# Patient Record
Sex: Female | Born: 1995 | Race: White | Hispanic: No | Marital: Single | Smoking: Never smoker
Health system: Southern US, Community
[De-identification: ages and names within clinical notes are randomized; demographics above are authoritative.]

## PROBLEM LIST (undated history)

## (undated) DIAGNOSIS — R42 Dizziness and giddiness: Secondary | ICD-10-CM

## (undated) DIAGNOSIS — K589 Irritable bowel syndrome without diarrhea: Secondary | ICD-10-CM

## (undated) DIAGNOSIS — T7840XA Allergy, unspecified, initial encounter: Secondary | ICD-10-CM

## (undated) DIAGNOSIS — S0291XA Unspecified fracture of skull, initial encounter for closed fracture: Secondary | ICD-10-CM

## (undated) DIAGNOSIS — Z9289 Personal history of other medical treatment: Secondary | ICD-10-CM

## (undated) DIAGNOSIS — O139 Gestational [pregnancy-induced] hypertension without significant proteinuria, unspecified trimester: Secondary | ICD-10-CM

## (undated) HISTORY — PX: WISDOM TOOTH EXTRACTION: SHX21

## (undated) HISTORY — DX: Gestational (pregnancy-induced) hypertension without significant proteinuria, unspecified trimester: O13.9

## (undated) HISTORY — DX: Irritable bowel syndrome, unspecified: K58.9

## (undated) HISTORY — PX: TONSILLECTOMY: SUR1361

## (undated) HISTORY — DX: Allergy, unspecified, initial encounter: T78.40XA

## (undated) HISTORY — PX: OTHER SURGICAL HISTORY: SHX169

## (undated) HISTORY — PX: ADENOIDECTOMY: SUR15

---
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
## 2016-06-17 ENCOUNTER — Emergency Department (HOSPITAL_COMMUNITY)
Admission: EM | Admit: 2016-06-17 | Discharge: 2016-06-17 | Disposition: A | Discharge status: Home / Self Care | Payer: BC Managed Care – PPO | Attending: Emergency Medicine | Admitting: Emergency Medicine

## 2016-06-17 ENCOUNTER — Emergency Department (HOSPITAL_COMMUNITY): Payer: BC Managed Care – PPO

## 2016-06-17 ENCOUNTER — Encounter (HOSPITAL_COMMUNITY): Payer: Self-pay | Admitting: *Deleted

## 2016-06-17 DIAGNOSIS — Y9389 Activity, other specified: Secondary | ICD-10-CM | POA: Insufficient documentation

## 2016-06-17 DIAGNOSIS — Y92410 Unspecified street and highway as the place of occurrence of the external cause: Secondary | ICD-10-CM | POA: Diagnosis not present

## 2016-06-17 DIAGNOSIS — Z7722 Contact with and (suspected) exposure to environmental tobacco smoke (acute) (chronic): Secondary | ICD-10-CM | POA: Diagnosis not present

## 2016-06-17 DIAGNOSIS — Y999 Unspecified external cause status: Secondary | ICD-10-CM | POA: Diagnosis not present

## 2016-06-17 DIAGNOSIS — S6991XA Unspecified injury of right wrist, hand and finger(s), initial encounter: Secondary | ICD-10-CM | POA: Diagnosis present

## 2016-06-17 DIAGNOSIS — S63501A Unspecified sprain of right wrist, initial encounter: Secondary | ICD-10-CM | POA: Diagnosis not present

## 2016-06-17 MED ORDER — CYCLOBENZAPRINE HCL 10 MG PO TABS: 10.0000 mg | ORAL_TABLET | Freq: Every evening | ORAL | 0 refills | Status: DC | PRN

## 2016-06-17 MED ORDER — MELOXICAM 15 MG PO TABS: 15.0000 mg | ORAL_TABLET | Freq: Every day | ORAL | 0 refills | Status: DC

## 2016-06-17 MED ORDER — HYDROCODONE-ACETAMINOPHEN 5-325 MG PO TABS
1.0000 | ORAL_TABLET | Freq: Once | ORAL | Status: AC
  Administered 2016-06-17: 1 via ORAL
  Filled 2016-06-17: qty 1

## 2016-06-17 NOTE — Discharge Instructions (Signed)
Ice - ice for 20 minutes at a time, several times a day Compression - wear brace to provide support Take Meloxicam once daily. You can also take Tylenol with this medicine but avoid Ibuprofen or Naproxen while taking Meloxicam. Take muscle relaxer as needed for sleep
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
## 2016-10-26 ENCOUNTER — Encounter: Payer: Self-pay | Admitting: Pediatrics

## 2016-10-26 ENCOUNTER — Ambulatory Visit (INDEPENDENT_AMBULATORY_CARE_PROVIDER_SITE_OTHER): Payer: BC Managed Care – PPO | Admitting: Pediatrics

## 2016-10-26 VITALS — BP 118/75 | Temp 97.4°F | Wt 113.6 lb

## 2016-10-26 DIAGNOSIS — H00012 Hordeolum externum right lower eyelid: Secondary | ICD-10-CM | POA: Diagnosis not present

## 2016-10-26 MED ORDER — POLYMYXIN B-TRIMETHOPRIM 10000-0.1 UNIT/ML-% OP SOLN: 1.0000 [drp] | OPHTHALMIC | 0 refills | Status: DC

## 2016-10-26 NOTE — Patient Instructions (Signed)

## 2016-10-26 NOTE — Progress Notes (Signed)
Chief Complaint  Patient presents with  . Stye    pt said it started coming up on right eyelid saturday.  has done hot compresses, stye medication OTC with no relief    HPI Chelsey Swanson here for swelling on her rt eye for 3 days, has done warm soaks with some improvement, no prior history of styes , is uncomfortable, no fever .  History was provided by the . patient.  No Known Allergies  Current Outpatient Prescriptions on File Prior to Visit  Medication Sig Dispense Refill  . adapalene (DIFFERIN) 0.1 % cream     . cyclobenzaprine (FLEXERIL) 10 MG tablet Take 1 tablet (10 mg total) by mouth at bedtime and may repeat dose one time if needed. 10 tablet 0  . Doxycycline Hyclate 150 MG TABS Take by mouth.    . fluticasone (FLONASE) 50 MCG/ACT nasal spray Place 2 sprays into both nostrils daily. (Patient taking differently: Place 2 sprays into both nostrils daily as needed for allergies. ) 16 g 6  . loratadine (CLARITIN) 10 MG tablet Take 10 mg by mouth daily.    . medroxyPROGESTERone (DEPO-PROVERA) 150 MG/ML injection Inject into the muscle.    . meloxicam (MOBIC) 15 MG tablet Take 1 tablet (15 mg total) by mouth daily. 15 tablet 0   No current facility-administered medications on file prior to visit.     Past Medical History:  Diagnosis Date  . Dizziness    normal cardiac workup  . H/O echocardiogram 04/2014   normal  . Skull fracture ([place])    21 yo   Past Surgical History:  Procedure Laterality Date  . TONSILLECTOMY    . tubes in ears      ROS:     Constitutional  Afebrile, normal appetite, normal activity.   Opthalmologic  As per HPPI.   ENT  no rhinorrhea or congestion , no sore throat, no ear pain. Respiratory  no cough , wheeze or chest pain.  Gastrointestinal  no nausea or vomiting,   Genitourinary  Voiding normally  Musculoskeletal  no complaints of pain, no injuries.   Dermatologic  no rashes or lesions    family history includes Healthy in her  mother.  Social History   Social History Narrative   Lives with Mom, Mom's boyfriend and brother. Have a dog at home. Brother and Mom's boyfriend both smoke.     BP 118/75   Temp (!) 97.4 F (36.3 C) (Temporal)   Wt 113 lb 9.6 oz (51.5 kg)   BMI 18.90 kg/m       Objective:         General alert in NAD  Derm   no rashes or lesions  Head Normocephalic, atraumatic                    Eyes Normal, no discharge moderate swelling medial aspect rt lower lid, slight swelling at contact point rt upper lid  Ears:   TMs normal bilaterally  Nose:   patent normal mucosa, turbinates normal, no rhinorhea  Oral cavity  moist mucous membranes, no lesions  Throat:   normal tonsils, without exudate or erythema  Neck supple FROM  Lymph:   no significant cervical adenopathy  Lungs:  clear with equal breath sounds bilaterally  Heart:   regular rate and rhythm, no murmur  Abdomen:  deferred  GU:  deferred  back No deformity  Extremities:   no deformity  Neuro:  intact no focal defects  Assessment/plan    1. Hordeolum externum of right lower eyelid Continue warm soaks, can use teabag as well, reviewed should improve in a few days - would refer opth if not better in 1-2 weeks or getting worse - trimethoprim-polymyxin b (POLYTRIM) ophthalmic solution; Place 1 drop into the right eye every 4 (four) hours.  Dispense: 10 mL; Refill: 0   Discussed routine care, Chelsey Swanson reports she has been going to the health dept for [facility] and physicals, advised she is due for well check here or she should consider transition to adult provider   Follow up  prn

## 2017-04-25 ENCOUNTER — Encounter: Payer: Self-pay | Admitting: Pediatrics

## 2017-04-25 ENCOUNTER — Ambulatory Visit (INDEPENDENT_AMBULATORY_CARE_PROVIDER_SITE_OTHER): Payer: BC Managed Care – PPO | Admitting: Pediatrics

## 2017-04-25 VITALS — BP 118/80 | Temp 99.6°F | Wt 120.0 lb

## 2017-04-25 DIAGNOSIS — H00024 Hordeolum internum left upper eyelid: Secondary | ICD-10-CM | POA: Diagnosis not present

## 2017-04-25 DIAGNOSIS — H00015 Hordeolum externum left lower eyelid: Secondary | ICD-10-CM

## 2017-04-25 MED ORDER — POLYMYXIN B-TRIMETHOPRIM 10000-0.1 UNIT/ML-% OP SOLN: 1.0000 [drp] | OPHTHALMIC | 0 refills | Status: DC

## 2017-04-25 NOTE — Progress Notes (Signed)
Subjective:     Patient ID: Chelsey Swanson, female   DOB: 17-Oct-1995, 22 y.o.   MRN: [id number]  HPI The patient is here today with her mother for stye of left eyelids. She has had this problem in the past, and the styes did improve with antibiotic eye drops.  She states that a few days ago, she started to have a bump in her left lower eyelid and then this morning another bump appeared in her left upper eyelid.    Review of Systems Per HPI     Objective:   Physical Exam BP 118/80   Temp 99.6 F (37.6 C) (Temporal)   Wt 120 lb (54.4 kg)   BMI 19.97 kg/m   General Appearance:  Alert, cooperative, no distress, appears stated age  Head:  Normocephalic, without obvious abnormality, atraumatic  Eyes:  PERRL, conjunctiva clear; erythematous papule on left external eyelid and erythematous papule of left upper eyelid       Assessment:     Hordeolum     Plan:     .1. Hordeolum externum of left lower eyelid - trimethoprim-polymyxin b (POLYTRIM) ophthalmic solution; Place 1 drop into the right eye every 4 (four) hours.  Dispense: 10 mL; Refill: 0  2. Hordeolum internum left upper eyelid - trimethoprim-polymyxin b (POLYTRIM) ophthalmic solution; Place 1 drop into the right eye every 4 (four) hours.  Dispense: 10 mL; Refill: 0  Hot compresses to eyes several times per day  RTC if not improving and will refer to Ophthalmology

## 2017-04-25 NOTE — Patient Instructions (Signed)

## 2018-01-11 ENCOUNTER — Encounter: Payer: Self-pay | Admitting: Pediatrics

## 2018-10-04 LAB — CYTOLOGY - PAP: CYTOLOGY - PAP: NEGATIVE

## 2021-01-16 ENCOUNTER — Encounter: Payer: Self-pay | Admitting: Advanced Practice Midwife

## 2021-02-12 ENCOUNTER — Ambulatory Visit (INDEPENDENT_AMBULATORY_CARE_PROVIDER_SITE_OTHER): Payer: BC Managed Care – PPO | Admitting: Dermatology

## 2021-02-12 ENCOUNTER — Other Ambulatory Visit: Payer: Self-pay

## 2021-02-12 DIAGNOSIS — D225 Melanocytic nevi of trunk: Secondary | ICD-10-CM

## 2021-02-12 DIAGNOSIS — D485 Neoplasm of uncertain behavior of skin: Secondary | ICD-10-CM

## 2021-02-12 DIAGNOSIS — D239 Other benign neoplasm of skin, unspecified: Secondary | ICD-10-CM

## 2021-02-12 HISTORY — DX: Other benign neoplasm of skin, unspecified: D23.9

## 2021-02-12 NOTE — Progress Notes (Signed)
   New Patient Visit  Subjective  Chelsey Swanson is a 25 y.o. female who presents for the following: irritated nevus (R low abdomen - rubs on clothing, patient would like to discuss treatment options. ).  The following portions of the chart were reviewed this encounter and updated as appropriate:   Tobacco  Allergies  Meds  Problems  Med Hx  Surg Hx  Fam Hx      Review of Systems:  No other skin or systemic complaints except as noted in HPI or Assessment and Plan.  Objective  Well appearing patient in no apparent distress; mood and affect are within normal limits.  A focused examination was performed including the abdomen. Relevant physical exam findings are noted in the Assessment and Plan.  R lower abdomen 0.7 cm erythematous brown papule.    Assessment & Plan  Neoplasm of uncertain behavior of skin R lower abdomen  Epidermal / dermal shaving  Lesion diameter (cm):  0.7 Informed consent: discussed and consent obtained   Timeout: patient name, date of birth, surgical site, and procedure verified   Procedure prep:  Patient was prepped and draped in usual sterile fashion Prep type:  Isopropyl alcohol Anesthesia: the lesion was anesthetized in a standard fashion   Anesthetic:  1% lidocaine w/ epinephrine 1-100,000 buffered w/ 8.4% NaHCO3 Instrument used: flexible razor blade   Hemostasis achieved with: pressure, aluminum chloride and electrodesiccation   Outcome: patient tolerated procedure well   Post-procedure details: sterile dressing applied and wound care instructions given   Dressing type: bandage and petrolatum    Specimen 1 - Surgical pathology Differential Diagnosis: D48.5 r/o irritated nevus r/o dysplasia Check Margins: No  Return if symptoms worsen or fail to improve.  Luther Redo, CMA, am acting as scribe for Forest Gleason, MD .  Documentation: I have reviewed the above documentation for accuracy and completeness, and I agree with the  above.  Forest Gleason, MD

## 2021-02-12 NOTE — Patient Instructions (Signed)

## 2021-02-17 ENCOUNTER — Telehealth: Payer: Self-pay

## 2021-02-17 NOTE — Telephone Encounter (Signed)
-----   Message from [place], MD sent at 02/16/2021  8:13 PM EST ----- Skin , right lower abdomen DYSPLASTIC COMPOUND NEVUS WITH MODERATE ATYPIA, DEEP MARGIN INVOLVED --> recheck once healed within the next 6 months; recommend FBSE  This is a MODERATELY ATYPICAL MOLE. On the spectrum from normal mole to melanoma skin cancer, this is in between the two. - We need to recheck this area sometime in the next 6 months to be sure there is no evidence of the atypical mole coming back. If there is any color coming back, we would recommend repeating the biopsy to be sure the cells look normal.  - People who have a history of atypical moles do have a slightly increased risk of developing melanoma somewhere on the body, so a yearly full body skin exam by a dermatologist is recommended.  - Monthly self skin checks and daily sun protection are also recommended.  - Please call if you notice a dark spot coming back where this biopsy was taken.  - Please also call if you notice any new or changing spots anywhere else on the body before your follow-up visit.

## 2021-02-17 NOTE — Telephone Encounter (Signed)
Spoke with pt and informed her of results. She had no concerns. Total body exam scheduled.

## 2021-02-18 ENCOUNTER — Ambulatory Visit: Payer: BC Managed Care – PPO | Admitting: Dermatology

## 2021-02-18 ENCOUNTER — Encounter: Payer: Self-pay | Admitting: Dermatology

## 2021-02-19 ENCOUNTER — Other Ambulatory Visit: Payer: Self-pay

## 2021-02-19 ENCOUNTER — Encounter: Payer: Self-pay | Admitting: Dermatology

## 2021-02-19 ENCOUNTER — Ambulatory Visit (INDEPENDENT_AMBULATORY_CARE_PROVIDER_SITE_OTHER): Payer: BC Managed Care – PPO | Admitting: Dermatology

## 2021-02-19 DIAGNOSIS — L814 Other melanin hyperpigmentation: Secondary | ICD-10-CM

## 2021-02-19 DIAGNOSIS — D225 Melanocytic nevi of trunk: Secondary | ICD-10-CM | POA: Diagnosis not present

## 2021-02-19 DIAGNOSIS — L578 Other skin changes due to chronic exposure to nonionizing radiation: Secondary | ICD-10-CM

## 2021-02-19 DIAGNOSIS — D229 Melanocytic nevi, unspecified: Secondary | ICD-10-CM | POA: Diagnosis not present

## 2021-02-19 DIAGNOSIS — Z1283 Encounter for screening for malignant neoplasm of skin: Secondary | ICD-10-CM

## 2021-02-19 DIAGNOSIS — Z86018 Personal history of other benign neoplasm: Secondary | ICD-10-CM

## 2021-02-19 DIAGNOSIS — D485 Neoplasm of uncertain behavior of skin: Secondary | ICD-10-CM

## 2021-02-19 NOTE — Progress Notes (Signed)
Follow-Up Visit   Subjective  Chelsey Swanson is a 25 y.o. female who presents for the following: FBSE (Patient here for full body skin exam and skin cancer screening. Patient with hx of dysplastic nevus. Patient does have a few nevi at neck that are kind of big but patient does not know if they have changed or not. ).  Patient does have hx of tanning bed use. No fhx or personal hx of skin cancer.   The following portions of the chart were reviewed this encounter and updated as appropriate:   Tobacco  Allergies  Meds  Problems  Med Hx  Surg Hx  Fam Hx     Review of Systems:  No other skin or systemic complaints except as noted in HPI or Assessment and Plan.  Objective  Well appearing patient in no apparent distress; mood and affect are within normal limits.  A full examination was performed including scalp, head, eyes, ears, nose, lips, neck, chest, axillae, abdomen, back, buttocks, bilateral upper extremities, bilateral lower extremities, hands, feet, fingers, toes, fingernails, and toenails. All findings within normal limits unless otherwise noted below.  Right Inframammary 0.6 cm slightly irregular medium brown thin papule      Assessment & Plan  Neoplasm of uncertain behavior of skin Right Inframammary  Epidermal / dermal shaving  Lesion diameter (cm):  0.6 Informed consent: discussed and consent obtained   Timeout: patient name, date of birth, surgical site, and procedure verified   Patient was prepped and draped in usual sterile fashion: area prepped with isopropyl alcohol. Anesthesia: the lesion was anesthetized in a standard fashion   Anesthetic:  1% lidocaine w/ epinephrine 1-100,000 buffered w/ 8.4% NaHCO3 Instrument used: flexible razor blade   Hemostasis achieved with: aluminum chloride   Outcome: patient tolerated procedure well   Post-procedure details: wound care instructions given   Additional details:  Mupirocin and a bandage applied  Specimen 1 -  Surgical pathology Differential Diagnosis: r/o Atypia  Check Margins: No 0.6 cm slightly irregular medium brown thin papule  History of Dysplastic Nevi - No evidence of recurrence today at right lower abdomen - Recommend regular full body skin exams - Recommend daily broad spectrum sunscreen SPF 30+ to sun-exposed areas, reapply every 2 hours as needed.  - Call if any new or changing lesions are noted between office visits  Lentigines - Scattered tan macules - Due to sun exposure - Benign-appearing, observe - Recommend daily broad spectrum sunscreen SPF 30+ to sun-exposed areas, reapply every 2 hours as needed. - Call for any changes  Melanocytic Nevi - Tan-brown and/or pink-flesh-colored symmetric macules and papules - Benign appearing on exam today - Observation - Call clinic for new or changing moles - Recommend daily use of broad spectrum spf 30+ sunscreen to sun-exposed areas.   Actinic Damage - Chronic condition, secondary to cumulative UV/sun exposure - diffuse scaly erythematous macules with underlying dyspigmentation - Recommend daily broad spectrum sunscreen SPF 30+ to sun-exposed areas, reapply every 2 hours as needed.  - Staying in the shade or wearing long sleeves, sun glasses (UVA+UVB protection) and wide brim hats (4-inch brim around the entire circumference of the hat) are also recommended for sun protection.  - Call for new or changing lesions.  Skin cancer screening performed today.  Return in about 1 year (around 02/19/2022) for TBSE.  Graciella Belton, RMA, am acting as scribe for Forest Gleason, MD .  Documentation: I have reviewed the above documentation for accuracy and completeness, and  I agree with the above.  Forest Gleason, MD

## 2021-02-19 NOTE — Patient Instructions (Addendum)
Wound Care Instructions  Cleanse wound gently with soap and water once a day then pat dry with clean gauze. Apply a thing coat of Petrolatum (petroleum jelly, "Vaseline") over the wound (unless you have an allergy to this). We recommend that you use a new, sterile tube of Vaseline. Do not pick or remove scabs. Do not remove the yellow or white "healing tissue" from the base of the wound.  Cover the wound with fresh, clean, nonstick gauze and secure with paper tape. You may use Band-Aids in place of gauze and tape if the would is small enough, but would recommend trimming much of the tape off as there is often too much. Sometimes Band-Aids can irritate the skin.  You should call the office for your biopsy report after 1 week if you have not already been contacted.  If you experience any problems, such as abnormal amounts of bleeding, swelling, significant bruising, significant pain, or evidence of infection, please call the office immediately.  Melanoma ABCDEs  Melanoma is the most dangerous type of skin cancer, and is the leading cause of death from skin disease.  You are more likely to develop melanoma if you: Have light-colored skin, light-colored eyes, or red or blond hair Spend a lot of time in the sun Tan regularly, either outdoors or in a tanning bed Have had blistering sunburns, especially during childhood Have a close family member who has had a melanoma Have atypical moles or large birthmarks  Early detection of melanoma is key since treatment is typically straightforward and cure rates are extremely high if we catch it early.   The first sign of melanoma is often a change in a mole or a new dark spot.  The ABCDE system is a way of remembering the signs of melanoma.  A for asymmetry:  The two halves do not match. B for border:  The edges of the growth are irregular. C for color:  A mixture of colors are present instead of an even brown color. D for diameter:  Melanomas are usually  (but not always) greater than 29mm - the size of a pencil eraser. E for evolution:  The spot keeps changing in size, shape, and color.  Please check your skin once per month between visits. You can use a small mirror in front and a large mirror behind you to keep an eye on the back side or your body.   If you see any new or changing lesions before your next follow-up, please call to schedule a visit.  Please continue daily skin protection including broad spectrum sunscreen SPF 30+ to sun-exposed areas, reapplying every 2 hours as needed when you're outdoors.    If You Need Anything After Your Visit  If you have any questions or concerns for your doctor, please call our main line at [phone] and press option 4 to reach your doctor's medical assistant. If no one answers, please leave a voicemail as directed and we will return your call as soon as possible. Messages left after 4 pm will be answered the following business day.   You may also send [place] a message via [place]. We typically respond to MyChart messages within 1-2 business days.  For prescription refills, please ask your pharmacy to contact our office. Our fax number is [phone].  If you have an urgent issue when the clinic is closed that cannot wait until the next business day, you can page your doctor at the number below.    Please note that  while we do our best to be available for urgent issues outside of office hours, we are not available 24/7.   If you have an urgent issue and are unable to reach [place], you may choose to seek medical care at your doctor's office, retail clinic, urgent care center, or emergency room.  If you have a medical emergency, please immediately call 911 or go to the emergency department.  Pager Numbers  - Dr. Nehemiah Massed: [phone]  - Dr. Laurence Ferrari: [phone]  - Dr. Nicole Kindred: [phone]  In the event of inclement weather, please call our main line at [phone] for an update on the status of any  delays or closures.  Dermatology Medication Tips: Please keep the boxes that topical medications come in in order to help keep track of the instructions about where and how to use these. Pharmacies typically print the medication instructions only on the boxes and not directly on the medication tubes.   If your medication is too expensive, please contact our office at [phone] option 4 or send [place] a message through [place].   We are unable to tell what your co-pay for medications will be in advance as this is different depending on your insurance coverage. However, we may be able to find a substitute medication at lower cost or fill out paperwork to get insurance to cover a needed medication.   If a prior authorization is required to get your medication covered by your insurance company, please allow [place] 1-2 business days to complete this process.  Drug prices often vary depending on where the prescription is filled and some pharmacies may offer cheaper prices.  The website www.goodrx.com contains coupons for medications through different pharmacies. The prices here do not account for what the cost may be with help from insurance (it may be cheaper with your insurance), but the website can give you the price if you did not use any insurance.  - You can print the associated coupon and take it with your prescription to the pharmacy.  - You may also stop by our office during regular business hours and pick up a GoodRx coupon card.  - If you need your prescription sent electronically to a different pharmacy, notify our office through [facility] or by phone at [phone] option 4.     Si Usted Necesita Algo Despus de Su Visita  Tambin puede enviarnos un mensaje a travs de Pharmacist, community. Por lo general respondemos a los mensajes de MyChart en el transcurso de 1 a 2 das hbiles.  Para renovar recetas, por favor pida a su farmacia que se ponga en contacto con nuestra oficina. Harland Dingwall de fax es [place] [phone].  Si tiene un asunto urgente cuando la clnica est cerrada y que no puede esperar hasta el siguiente da hbil, puede llamar/localizar a su doctor(a) al nmero que aparece a continuacin.   Por favor, tenga en cuenta que aunque hacemos todo lo posible para estar disponibles para asuntos urgentes fuera del horario de [place], no estamos disponibles las 24 horas del da, los 7 das de la [place].   Si tiene un problema urgente y no puede comunicarse con nosotros, puede optar por buscar atencin mdica  en el consultorio de su doctor(a), en una clnica privada, en un centro de atencin urgente o en una sala de emergencias.  Si tiene Engineering geologist, por favor llame inmediatamente al 911 o vaya a la sala de emergencias.  Nmeros de bper  - Dr. Nehemiah Massed: [phone]  -  DraLaurence Ferrari: [phone]  - Dra. Nicole Kindred: [phone]  En caso de inclemencias del [place], por favor llame a Johnsie Kindred principal al [phone] para una actualizacin sobre el [place] de cualquier retraso o cierre.  Consejos para la medicacin en dermatologa: Por favor, guarde las cajas en las que vienen los medicamentos de uso tpico para ayudarle a seguir las instrucciones sobre dnde y cmo usarlos. Las farmacias generalmente imprimen las instrucciones del medicamento slo en las cajas y no directamente en los tubos del [place].   Si su medicamento es muy caro, por favor, pngase en contacto con Zigmund Daniel llamando al [phone] y presione la opcin 4 o envenos un mensaje a travs de Pharmacist, community.   No podemos decirle cul ser su copago por los medicamentos por adelantado ya que esto es diferente dependiendo de la cobertura de su seguro. Sin embargo, es posible que podamos encontrar un medicamento sustituto a Electrical engineer un formulario para que el seguro cubra el medicamento que se considera necesario.   Si se requiere una autorizacin previa para que su compaa de  seguros [place] su medicamento, por favor permtanos de 1 a 2 das hbiles para completar este proceso.  Los precios de los medicamentos varan con frecuencia dependiendo del Environmental consultant de dnde se surte la receta y alguna farmacias pueden ofrecer precios ms baratos.  El sitio web www.goodrx.com tiene cupones para medicamentos de Airline pilot. Los precios aqu no tienen en cuenta lo que podra costar con la ayuda del seguro (puede ser ms barato con su seguro), pero el sitio web puede darle el precio si no utiliz Research scientist (physical sciences).  - Puede imprimir el cupn correspondiente y llevarlo con su receta a la farmacia.  - Tambin puede pasar por nuestra oficina durante el horario de atencin regular y Charity fundraiser una tarjeta de cupones de GoodRx.  - Si necesita que su receta se enve electrnicamente a una farmacia diferente, informe a nuestra oficina a travs de MyChart de [place] o por telfono llamando al [phone] y presione la opcin 4.

## 2021-02-20 ENCOUNTER — Other Ambulatory Visit (HOSPITAL_COMMUNITY)
Admission: RE | Admit: 2021-02-20 | Discharge: 2021-02-20 | Disposition: A | Discharge status: Home / Self Care | Payer: BC Managed Care – PPO | Source: Ambulatory Visit | Attending: Adult Health | Admitting: Adult Health

## 2021-02-20 ENCOUNTER — Ambulatory Visit: Payer: BC Managed Care – PPO | Admitting: Adult Health

## 2021-02-20 ENCOUNTER — Encounter: Payer: Self-pay | Admitting: Adult Health

## 2021-02-20 VITALS — BP 126/81 | HR 99 | Ht 65.0 in | Wt 126.0 lb

## 2021-02-20 DIAGNOSIS — Z30013 Encounter for initial prescription of injectable contraceptive: Secondary | ICD-10-CM | POA: Diagnosis not present

## 2021-02-20 DIAGNOSIS — Z113 Encounter for screening for infections with a predominantly sexual mode of transmission: Secondary | ICD-10-CM | POA: Diagnosis present

## 2021-02-20 DIAGNOSIS — Z01419 Encounter for gynecological examination (general) (routine) without abnormal findings: Secondary | ICD-10-CM | POA: Diagnosis not present

## 2021-02-20 MED ORDER — MEDROXYPROGESTERONE ACETATE 150 MG/ML IM SUSP: 150.0000 mg | INTRAMUSCULAR | 4 refills | Status: DC

## 2021-02-20 NOTE — Progress Notes (Signed)
Patient ID: [name], female   DOB: [birth year], 25 y.o.   MRN: [id number] History of Present Illness: Chelsey Swanson is a 25 year old white female,single, G0P0, in for well woman gyn exam, she had pap July 2020 at health dept. She is a new pt. PCP is Dr Nevada Crane.   Current Medications, Allergies, Past Medical History, Past Surgical History, Family History and Social History were reviewed in [employer] record.     Review of Systems:  Patient denies any headaches, hearing loss, fatigue, blurred vision, shortness of breath, chest pain, abdominal pain, problems with bowel movements, urination, or intercourse. No joint pain or mood swings.  Has spotting has been on depo 104 mg Sub Q  Physical Exam:BP 126/81 (BP Location: Right Arm, Patient Position: Sitting, Cuff Size: Normal)   Pulse 99   Ht 5\' 5"  (1.651 m)   Wt 126 lb (57.2 kg)   LMP 02/19/2021 (Exact Date)   BMI 20.97 kg/m   General:  Well developed, well nourished, no acute distress Skin:  Warm and dry,has band aides on trunk had moles removed Neck:  Midline trachea, normal thyroid, good ROM, no lymphadenopathy Lungs; Clear to auscultation bilaterally Breast:  No dominant palpable mass, retraction, or nipple discharge Cardiovascular: Regular rate and rhythm Abdomen:  Soft, non tender, no hepatosplenomegaly Pelvic:  External genitalia is normal in appearance, no lesions.  The vagina is normal in appearance. Urethra has no lesions or masses. The cervix is nulliparous, CV swab obtained for GC/CHL.  Uterus is felt to be normal size, shape, and contour.  No adnexal masses or tenderness noted.Bladder is non tender, no masses felt. Rectal: Deferred Extremities/musculoskeletal:  No swelling or varicosities noted, no clubbing or cyanosis Psych:  No mood changes, alert and cooperative,seems happy AA is 4 Fall risk is low Depression screen PHQ 2/9 02/20/2021  Decreased Interest 0  Down, Depressed, Hopeless 0  PHQ - 2 Score 0     GAD 7 : Generalized Anxiety Score 02/20/2021  Nervous, Anxious, on Edge 0  Control/stop worrying 0  Worry too much - different things 0  Trouble relaxing 0  Restless 0  Easily annoyed or irritable 0  Afraid - awful might happen 0  Total GAD 7 Score 0        Upstream - 02/20/21 1047       Pregnancy Intention Screening   Does the patient want to become pregnant in the next year? No    Does the patient's partner want to become pregnant in the next year? No    Would the patient like to discuss contraceptive options today? No      Contraception Wrap Up   Current Method Hormonal Injection    End Method Hormonal Injection    Contraception Counseling Provided No           . Examination chaperoned by Celene Squibb LPN  Impression and Plan: 1. Screen for STD (sexually transmitted disease) CV swab sent for GC/CHL Check HIV and RPR and hepatis C antibody   2. Encounter for well woman exam with routine gynecological exam Physical in 1 year with pap  3. Encounter for initial prescription of injectable contraceptive Will change to IM depo Meds ordered this encounter  Medications   medroxyPROGESTERone (DEPO-PROVERA) 150 MG/ML injection    Sig: Inject 1 mL (150 mg total) into the muscle every 3 (three) months.    Dispense:  1 mL    Refill:  4    Order Specific Question:  Supervising Provider    Answer:   Florian Buff [[id number]]   Return 04/24/21 for depo

## 2021-02-21 LAB — HEPATITIS C ANTIBODY: Hep C Virus Ab: <0.1 s/co ratio (ref 0.0–0.9)

## 2021-02-21 LAB — RPR: RPR Ser Ql: NONREACTIVE

## 2021-02-21 LAB — HIV ANTIBODY (ROUTINE TESTING W REFLEX): HIV Screen 4th Generation wRfx: NONREACTIVE

## 2021-02-23 ENCOUNTER — Telehealth: Payer: Self-pay

## 2021-02-23 LAB — CERVICOVAGINAL ANCILLARY ONLY
Chlamydia: POSITIVE — AB
Comment: NEGATIVE
Comment: NORMAL
Neisseria Gonorrhea: NEGATIVE

## 2021-02-23 NOTE — Telephone Encounter (Signed)
Spoke with pt and informed her of results. She had no concerns.

## 2021-02-23 NOTE — Telephone Encounter (Signed)
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 On the spectrum from normal mole to melanoma skin cancer, this is in between the two. - We need to recheck this area sometime in the next 6 months to be sure there is no evidence of the atypical mole coming back. If there is any color coming back, we would recommend repeating the biopsy to be sure the cells look normal.  - People who have a history of atypical moles do have a slightly increased risk of developing melanoma somewhere on the body, so a yearly full body skin exam by a dermatologist is recommended.  - Monthly self skin checks and daily sun protection are also recommended.  - Please call if you notice a dark spot coming back where this biopsy was taken.  - Please also call if you notice any new or changing spots anywhere else on the body before your follow-up visit.   MAs please call and schedule f/u. Thank you!

## 2021-02-24 ENCOUNTER — Encounter: Payer: Self-pay | Admitting: Adult Health

## 2021-02-24 ENCOUNTER — Other Ambulatory Visit: Payer: Self-pay | Admitting: Adult Health

## 2021-02-24 DIAGNOSIS — A749 Chlamydial infection, unspecified: Secondary | ICD-10-CM | POA: Insufficient documentation

## 2021-02-24 HISTORY — DX: Chlamydial infection, unspecified: A74.9

## 2021-02-24 MED ORDER — DOXYCYCLINE HYCLATE 100 MG PO TABS: 100.0000 mg | ORAL_TABLET | Freq: Two times a day (BID) | ORAL | 0 refills | Status: DC

## 2021-02-24 NOTE — Progress Notes (Signed)
+  CHlamydia on CV swab will rx doxycycline

## 2021-04-24 ENCOUNTER — Other Ambulatory Visit: Payer: Self-pay

## 2021-04-24 ENCOUNTER — Encounter: Payer: BC Managed Care – PPO | Admitting: Adult Health

## 2021-04-24 ENCOUNTER — Ambulatory Visit (INDEPENDENT_AMBULATORY_CARE_PROVIDER_SITE_OTHER): Payer: BC Managed Care – PPO | Admitting: *Deleted

## 2021-04-24 DIAGNOSIS — Z3042 Encounter for surveillance of injectable contraceptive: Secondary | ICD-10-CM

## 2021-04-24 MED ORDER — MEDROXYPROGESTERONE ACETATE 150 MG/ML IM SUSP
150.0000 mg | Freq: Once | INTRAMUSCULAR | Status: AC
  Administered 2021-04-24: 150 mg via INTRAMUSCULAR

## 2021-04-24 NOTE — Progress Notes (Signed)
° °  NURSE VISIT- INJECTION  SUBJECTIVE:  Chelsey Swanson is a 26 y.o. G0P0000 female here for a Depo Provera for contraception/period management. She is a GYN patient.   OBJECTIVE:  There were no vitals taken for this visit.  Appears well, in no apparent distress  Injection administered in: Right upper quad. gluteus  Meds ordered this encounter  Medications   medroxyPROGESTERone (DEPO-PROVERA) injection 150 mg    ASSESSMENT: GYN patient Depo Provera for contraception/period management PLAN: Follow-up: in 11-13 weeks for next Depo   Chelsey Swanson  04/24/2021 11:03 AM

## 2021-04-27 NOTE — Progress Notes (Signed)
This encounter was created in error - please disregard.

## 2021-07-10 ENCOUNTER — Ambulatory Visit (INDEPENDENT_AMBULATORY_CARE_PROVIDER_SITE_OTHER): Payer: BC Managed Care – PPO

## 2021-07-10 DIAGNOSIS — Z3042 Encounter for surveillance of injectable contraceptive: Secondary | ICD-10-CM

## 2021-07-10 MED ORDER — MEDROXYPROGESTERONE ACETATE 150 MG/ML IM SUSY: PREFILLED_SYRINGE | Freq: Once | INTRAMUSCULAR | Status: AC

## 2021-07-10 NOTE — Progress Notes (Signed)
? ?  NURSE VISIT- INJECTION ? ?SUBJECTIVE:  ?Chelsey Swanson is a 26 y.o. G0P0000 female here for a Depo Provera for contraception/period management. She is a GYN patient.  ? ?OBJECTIVE:  ?There were no vitals taken for this visit.  ?Appears well, in no apparent distress ? ?Injection administered in: Left upper quad. gluteus ? ?Meds ordered this encounter  ?Medications  ? medroxyPROGESTERone Acetate SUSY  ? ? ?ASSESSMENT: ?GYN patient Depo Provera for contraception/period management ?PLAN: ?Follow-up: in 11-13 weeks for next Depo  ? ?Chelsey Swanson  ?07/10/2021 ?11:15 AM ? ?

## 2021-07-17 ENCOUNTER — Ambulatory Visit: Payer: BC Managed Care – PPO

## 2021-10-02 ENCOUNTER — Ambulatory Visit (INDEPENDENT_AMBULATORY_CARE_PROVIDER_SITE_OTHER): Payer: BC Managed Care – PPO | Admitting: *Deleted

## 2021-10-02 DIAGNOSIS — Z3042 Encounter for surveillance of injectable contraceptive: Secondary | ICD-10-CM

## 2021-10-02 MED ORDER — MEDROXYPROGESTERONE ACETATE 150 MG/ML IM SUSP
150.0000 mg | Freq: Once | INTRAMUSCULAR | Status: AC
  Administered 2021-10-02: 150 mg via INTRAMUSCULAR

## 2021-10-02 NOTE — Progress Notes (Signed)
   NURSE VISIT- INJECTION  SUBJECTIVE:  Chelsey Swanson is a 26 y.o. G0P0000 female here for a Depo Provera for contraception/period management. She is a GYN patient.   OBJECTIVE:  There were no vitals taken for this visit.  Appears well, in no apparent distress  Injection administered in: Right upper quad. gluteus  Meds ordered this encounter  Medications   medroxyPROGESTERone (DEPO-PROVERA) injection 150 mg    ASSESSMENT: GYN patient Depo Provera for contraception/period management PLAN: Follow-up: in 11-13 weeks for next Depo   Chelsey Swanson  10/02/2021 11:28 AM

## 2021-12-24 ENCOUNTER — Ambulatory Visit: Payer: BC Managed Care – PPO

## 2021-12-24 ENCOUNTER — Ambulatory Visit (INDEPENDENT_AMBULATORY_CARE_PROVIDER_SITE_OTHER): Payer: BC Managed Care – PPO | Admitting: *Deleted

## 2021-12-24 DIAGNOSIS — Z3042 Encounter for surveillance of injectable contraceptive: Secondary | ICD-10-CM | POA: Diagnosis not present

## 2021-12-24 MED ORDER — MEDROXYPROGESTERONE ACETATE 150 MG/ML IM SUSP
150.0000 mg | Freq: Once | INTRAMUSCULAR | Status: AC
  Administered 2021-12-24: 150 mg via INTRAMUSCULAR

## 2021-12-24 NOTE — Progress Notes (Signed)
   NURSE VISIT- INJECTION  SUBJECTIVE:  Chelsey Swanson is a 26 y.o. G0P0000 female here for a Depo Provera for contraception/period management. She is a GYN patient.   OBJECTIVE:  There were no vitals taken for this visit.  Appears well, in no apparent distress  Injection administered in: Left upper quad. gluteus  Meds ordered this encounter  Medications   medroxyPROGESTERone (DEPO-PROVERA) injection 150 mg    ASSESSMENT: GYN patient Depo Provera for contraception/period management PLAN: Follow-up: in 11-13 weeks for next Depo   Alice Rieger  12/24/2021 3:21 PM

## 2021-12-25 ENCOUNTER — Ambulatory Visit: Payer: BC Managed Care – PPO

## 2022-02-25 ENCOUNTER — Encounter: Payer: BC Managed Care – PPO | Admitting: Dermatology

## 2022-03-19 ENCOUNTER — Ambulatory Visit: Payer: BC Managed Care – PPO

## 2023-03-16 NOTE — L&D Delivery Note (Signed)
 Delivery Note At 2:47 PM a viable and healthy female was delivered via Vaginal, Spontaneous (Presentation: Right Occiput Anterior).  APGAR: 9, 9; weight pending .   Placenta status: Spontaneous, Intact.  Cord: 3 vessels with the following complications: None.    Following amniotomy the patient progressed rapidly in labor and delivered a vigorous female infant in the vertex right compound occiput anterior presentation with right hand.  A body cord was noted at the time of delivery and reduced after delivery.  The infant was passed to the waiting maternal abdomen and bulb suctioned and cried vigorously.  Following a 1 minute delay the cord was clamped and cut.  The placenta delivered spontaneously, intact, with three-vessel cord.  A second-degree perineal laceration was repaired with 3-0 Vicryl after the perineum was infiltrated with 1% lidocaine  plain.  All sponge, needle, instrument counts were correct.  Mom and baby are doing well following delivery.  The patient desires circumcision.  Anesthesia: Epidural Episiotomy: None Lacerations: 2nd degree Suture Repair: 3.0 vicryl Est. Blood Loss (mL):  245  Mom to postpartum.  Baby to Couplet care / Skin to Skin.  Marjorie Gull 02/22/2024, 3:19 PM

## 2023-06-20 DIAGNOSIS — D508 Other iron deficiency anemias: Secondary | ICD-10-CM | POA: Diagnosis not present

## 2023-06-20 DIAGNOSIS — Z136 Encounter for screening for cardiovascular disorders: Secondary | ICD-10-CM | POA: Diagnosis not present

## 2023-06-20 DIAGNOSIS — Z0000 Encounter for general adult medical examination without abnormal findings: Secondary | ICD-10-CM | POA: Diagnosis not present

## 2023-06-20 DIAGNOSIS — Z1330 Encounter for screening examination for mental health and behavioral disorders, unspecified: Secondary | ICD-10-CM | POA: Diagnosis not present

## 2023-06-20 DIAGNOSIS — J302 Other seasonal allergic rhinitis: Secondary | ICD-10-CM | POA: Diagnosis not present

## 2023-06-20 DIAGNOSIS — Z1331 Encounter for screening for depression: Secondary | ICD-10-CM | POA: Diagnosis not present

## 2023-06-20 DIAGNOSIS — Z3201 Encounter for pregnancy test, result positive: Secondary | ICD-10-CM | POA: Diagnosis not present

## 2023-06-20 DIAGNOSIS — Z13220 Encounter for screening for lipoid disorders: Secondary | ICD-10-CM | POA: Diagnosis not present

## 2023-07-08 ENCOUNTER — Encounter: Payer: Self-pay | Admitting: *Deleted

## 2023-07-08 ENCOUNTER — Ambulatory Visit: Admitting: *Deleted

## 2023-07-08 VITALS — BP 122/79 | HR 106 | Ht 66.0 in | Wt 127.5 lb

## 2023-07-08 DIAGNOSIS — Z3201 Encounter for pregnancy test, result positive: Secondary | ICD-10-CM

## 2023-07-08 LAB — POCT URINE PREGNANCY: Preg Test, Ur: POSITIVE — AB

## 2023-07-08 NOTE — Progress Notes (Signed)
   NURSE VISIT- PREGNANCY CONFIRMATION   SUBJECTIVE:  Chelsey Swanson is a 28 y.o. G1P0000 female at [redacted]w[redacted]d by certain LMP of Patient's last menstrual period was 05/18/2023. Here for pregnancy confirmation.  Home pregnancy test: positive x 4.   She reports cramping.  She is taking prenatal vitamins.    OBJECTIVE:  BP 122/79 (BP Location: Right Arm, Patient Position: Sitting, Cuff Size: Normal)   Pulse (!) 106   Ht 5\' 6"  (1.676 m)   Wt 127 lb 8 oz (57.8 kg)   LMP 05/18/2023   BMI 20.58 kg/m   Appears well, in no apparent distress  Results for orders placed or performed in visit on 07/08/23 (from the past 24 hours)  POCT urine pregnancy   Collection Time: 07/08/23 11:30 AM  Result Value Ref Range   Preg Test, Ur Positive (A) Negative    ASSESSMENT: Positive pregnancy test, [redacted]w[redacted]d by LMP    PLAN: Schedule for dating ultrasound in 1-2 weeks Prenatal vitamins: continue   Nausea medicines: not currently needed   OB packet given: Yes  Alphonso Aschoff  07/08/2023 11:44 AM

## 2023-07-20 ENCOUNTER — Other Ambulatory Visit: Payer: Self-pay | Admitting: Obstetrics & Gynecology

## 2023-07-20 DIAGNOSIS — O3680X0 Pregnancy with inconclusive fetal viability, not applicable or unspecified: Secondary | ICD-10-CM

## 2023-07-25 ENCOUNTER — Encounter: Payer: Self-pay | Admitting: Radiology

## 2023-07-25 ENCOUNTER — Ambulatory Visit: Admitting: Radiology

## 2023-07-25 DIAGNOSIS — Z3491 Encounter for supervision of normal pregnancy, unspecified, first trimester: Secondary | ICD-10-CM | POA: Diagnosis not present

## 2023-07-25 DIAGNOSIS — Z3A09 9 weeks gestation of pregnancy: Secondary | ICD-10-CM

## 2023-07-25 DIAGNOSIS — O3680X0 Pregnancy with inconclusive fetal viability, not applicable or unspecified: Secondary | ICD-10-CM

## 2023-07-25 NOTE — Progress Notes (Signed)
 US : GA = 9+5 by LMP  Anteverted uterus with viable early IUP.  GS intact within upper mid uterine cavity CRL = 26.1 mm = 9+3 weeks, FHR = 179 bpm,   Yolk Sac = 5.2 mm, Normal ov's  - Neg adnexal regions  neg CDS - no free fluid present EDD = 02-22-24

## 2023-08-14 DIAGNOSIS — Z3482 Encounter for supervision of other normal pregnancy, second trimester: Secondary | ICD-10-CM | POA: Diagnosis not present

## 2023-08-14 DIAGNOSIS — Z3483 Encounter for supervision of other normal pregnancy, third trimester: Secondary | ICD-10-CM | POA: Diagnosis not present

## 2023-08-23 ENCOUNTER — Encounter: Payer: Self-pay | Admitting: Advanced Practice Midwife

## 2023-08-23 ENCOUNTER — Other Ambulatory Visit: Payer: Self-pay | Admitting: Obstetrics & Gynecology

## 2023-08-23 DIAGNOSIS — Z3682 Encounter for antenatal screening for nuchal translucency: Secondary | ICD-10-CM

## 2023-08-23 DIAGNOSIS — Z3400 Encounter for supervision of normal first pregnancy, unspecified trimester: Secondary | ICD-10-CM | POA: Insufficient documentation

## 2023-08-24 ENCOUNTER — Encounter: Admitting: *Deleted

## 2023-08-24 ENCOUNTER — Ambulatory Visit: Admitting: Advanced Practice Midwife

## 2023-08-24 ENCOUNTER — Ambulatory Visit (INDEPENDENT_AMBULATORY_CARE_PROVIDER_SITE_OTHER)

## 2023-08-24 ENCOUNTER — Encounter: Payer: Self-pay | Admitting: Advanced Practice Midwife

## 2023-08-24 ENCOUNTER — Other Ambulatory Visit (HOSPITAL_COMMUNITY)
Admission: RE | Admit: 2023-08-24 | Discharge: 2023-08-24 | Disposition: A | Discharge status: Home / Self Care | Source: Ambulatory Visit | Attending: Advanced Practice Midwife | Admitting: Advanced Practice Midwife

## 2023-08-24 VITALS — BP 106/73 | HR 98 | Wt 128.0 lb

## 2023-08-24 DIAGNOSIS — Z3402 Encounter for supervision of normal first pregnancy, second trimester: Secondary | ICD-10-CM

## 2023-08-24 DIAGNOSIS — Z131 Encounter for screening for diabetes mellitus: Secondary | ICD-10-CM | POA: Diagnosis not present

## 2023-08-24 DIAGNOSIS — Z3A13 13 weeks gestation of pregnancy: Secondary | ICD-10-CM

## 2023-08-24 DIAGNOSIS — Z3A14 14 weeks gestation of pregnancy: Secondary | ICD-10-CM

## 2023-08-24 DIAGNOSIS — Z363 Encounter for antenatal screening for malformations: Secondary | ICD-10-CM

## 2023-08-24 DIAGNOSIS — Z01419 Encounter for gynecological examination (general) (routine) without abnormal findings: Secondary | ICD-10-CM | POA: Diagnosis present

## 2023-08-24 DIAGNOSIS — Z1151 Encounter for screening for human papillomavirus (HPV): Secondary | ICD-10-CM | POA: Insufficient documentation

## 2023-08-24 DIAGNOSIS — Z113 Encounter for screening for infections with a predominantly sexual mode of transmission: Secondary | ICD-10-CM

## 2023-08-24 DIAGNOSIS — Z3682 Encounter for antenatal screening for nuchal translucency: Secondary | ICD-10-CM

## 2023-08-24 DIAGNOSIS — Z124 Encounter for screening for malignant neoplasm of cervix: Secondary | ICD-10-CM | POA: Insufficient documentation

## 2023-08-24 NOTE — Patient Instructions (Signed)
Liany, thank you for choosing our office today! We appreciate the opportunity to meet your healthcare needs. You may receive a short survey by mail, e-mail, or through MyChart. If you are happy with your care we would appreciate if you could take just a few minutes to complete the survey questions. We read all of your comments and take your feedback very seriously. Thank you again for choosing our office.  [facility] ([address] [place], [place] [address]) Entrance C, located off of [address] Free 24/7 valet parking   Nausea & Vomiting Have saltine crackers or pretzels by your bed and eat a few bites before you raise your head out of bed in the morning Eat small frequent meals throughout the day instead of large meals Drink plenty of fluids throughout the day to stay hydrated, just don't drink a lot of fluids with your meals.  This can make your stomach fill up faster making you feel sick Do not brush your teeth right after you eat Products with real ginger are good for nausea, like ginger ale and ginger hard candy Make sure it says made with real ginger! Sucking on sour candy like lemon heads is also good for nausea If your prenatal vitamins make you nauseated, take them at night so you will sleep through the nausea Sea Bands If you feel like you need medicine for the nausea & vomiting please let us know If you are unable to keep any fluids or food down please let us know   Constipation Drink plenty of fluid, preferably water, throughout the day Eat foods high in fiber such as fruits, vegetables, and grains Exercise, such as walking, is a good way to keep your bowels regular Drink warm fluids, especially warm prune juice, or decaf coffee Eat a 1/2 cup of real oatmeal (not instant), 1/2 cup applesauce, and 1/2-1 cup warm prune juice every day If needed, you may take Colace (docusate sodium) stool softener  once or twice a day to help keep the stool soft.  If you still are having problems with constipation, you may take Miralax once daily as needed to help keep your bowels regular.   Home Blood Pressure Monitoring for Patients   Your provider has recommended that you check your blood pressure (BP) at least once a week at home. If you do not have a blood pressure cuff at home, one will be provided for you. Contact your provider if you have not received your monitor within 1 week.   Helpful Tips for Accurate Home Blood Pressure Checks  Don't smoke, exercise, or drink caffeine 30 minutes before checking your BP Use the restroom before checking your BP (a full bladder can raise your pressure) Relax in a comfortable upright chair Feet on the ground Left arm resting comfortably on a flat surface at the level of your heart Legs uncrossed Back supported Sit quietly and don't talk Place the cuff on your bare arm Adjust snuggly, so that only two fingertips can fit between your skin and the top of the cuff Check 2 readings separated by at least one minute Keep a log of your BP readings For a visual, please reference this diagram: http://ccnc.care/bpdiagram  Provider Name: [facility]     Phone: [phone]  Zone 1: ALL CLEAR  Continue to monitor your symptoms:  BP reading is less than 140 (top number) or less than 90 (bottom   number)  No right upper stomach pain No headaches or seeing spots No feeling nauseated or throwing up No swelling in face and hands  Zone 2: CAUTION Call your doctor's office for any of the following:  BP reading is greater than 140 (top number) or greater than 90 (bottom number)  Stomach pain under your ribs in the middle or right side Headaches or seeing spots Feeling nauseated or throwing up Swelling in face and hands  Zone 3: EMERGENCY  Seek immediate medical care if you have any of the following:  BP reading is greater than160 (top number) or greater than  110 (bottom number) Severe headaches not improving with Tylenol Serious difficulty catching your breath Any worsening symptoms from Zone 2    First Trimester of Pregnancy The first trimester of pregnancy is from week 1 until the end of week 12 (months 1 through 3). A week after a sperm fertilizes an egg, the egg will implant on the wall of the uterus. This embryo will begin to develop into a baby. Genes from you and your partner are forming the baby. The female genes determine whether the baby is a boy or a girl. At 6-8 weeks, the eyes and face are formed, and the heartbeat can be seen on ultrasound. At the end of 12 weeks, all the baby's organs are formed.  Now that you are pregnant, you will want to do everything you can to have a healthy baby. Two of the most important things are to get good prenatal care and to follow your health care provider's instructions. Prenatal care is all the medical care you receive before the baby's birth. This care will help prevent, find, and treat any problems during the pregnancy and childbirth. BODY CHANGES Your body goes through many changes during pregnancy. The changes vary from woman to woman.  You may gain or lose a couple of pounds at first. You may feel sick to your stomach (nauseous) and throw up (vomit). If the vomiting is uncontrollable, call your health care provider. You may tire easily. You may develop headaches that can be relieved by medicines approved by your health care provider. You may urinate more often. Painful urination may mean you have a bladder infection. You may develop heartburn as a result of your pregnancy. You may develop constipation because certain hormones are causing the muscles that push waste through your intestines to slow down. You may develop hemorrhoids or swollen, bulging veins (varicose veins). Your breasts may begin to grow larger and become tender. Your nipples may stick out more, and the tissue that surrounds them  (areola) may become darker. Your gums may bleed and may be sensitive to brushing and flossing. Dark spots or blotches (chloasma, mask of pregnancy) may develop on your face. This will likely fade after the baby is born. Your menstrual periods will stop. You may have a loss of appetite. You may develop cravings for certain kinds of food. You may have changes in your emotions from day to day, such as being excited to be pregnant or being concerned that something may go wrong with the pregnancy and baby. You may have more vivid and strange dreams. You may have changes in your hair. These can include thickening of your hair, rapid growth, and changes in texture. Some women also have hair loss during or after pregnancy, or hair that feels dry or thin. Your hair will most likely return to normal after your baby is born. WHAT TO EXPECT AT YOUR PRENATAL   VISITS During a routine prenatal visit: You will be weighed to make sure you and the baby are growing normally. Your blood pressure will be taken. Your abdomen will be measured to track your baby's growth. The fetal heartbeat will be listened to starting around week 10 or 12 of your pregnancy. Test results from any previous visits will be discussed. Your health care provider may ask you: How you are feeling. If you are feeling the baby move. If you have had any abnormal symptoms, such as leaking fluid, bleeding, severe headaches, or abdominal cramping. If you have any questions. Other tests that may be performed during your first trimester include: Blood tests to find your blood type and to check for the presence of any previous infections. They will also be used to check for low iron levels (anemia) and Rh antibodies. Later in the pregnancy, blood tests for diabetes will be done along with other tests if problems develop. Urine tests to check for infections, diabetes, or protein in the urine. An ultrasound to confirm the proper growth and development  of the baby. An amniocentesis to check for possible genetic problems. Fetal screens for spina bifida and Down syndrome. You may need other tests to make sure you and the baby are doing well. HOME CARE INSTRUCTIONS  Medicines Follow your health care provider's instructions regarding medicine use. Specific medicines may be either safe or unsafe to take during pregnancy. Take your prenatal vitamins as directed. If you develop constipation, try taking a stool softener if your health care provider approves. Diet Eat regular, well-balanced meals. Choose a variety of foods, such as meat or vegetable-based protein, fish, milk and low-fat dairy products, vegetables, fruits, and whole grain breads and cereals. Your health care provider will help you determine the amount of weight gain that is right for you. Avoid raw meat and uncooked cheese. These carry germs that can cause birth defects in the baby. Eating four or five small meals rather than three large meals a day may help relieve nausea and vomiting. If you start to feel nauseous, eating a few soda crackers can be helpful. Drinking liquids between meals instead of during meals also seems to help nausea and vomiting. If you develop constipation, eat more high-fiber foods, such as fresh vegetables or fruit and whole grains. Drink enough fluids to keep your urine clear or pale yellow. Activity and Exercise Exercise only as directed by your health care provider. Exercising will help you: Control your weight. Stay in shape. Be prepared for labor and delivery. Experiencing pain or cramping in the lower abdomen or low back is a good sign that you should stop exercising. Check with your health care provider before continuing normal exercises. Try to avoid standing for long periods of time. Move your legs often if you must stand in one place for a long time. Avoid heavy lifting. Wear low-heeled shoes, and practice good posture. You may continue to have sex  unless your health care provider directs you otherwise. Relief of Pain or Discomfort Wear a good support bra for breast tenderness.   Take warm sitz baths to soothe any pain or discomfort caused by hemorrhoids. Use hemorrhoid cream if your health care provider approves.   Rest with your legs elevated if you have leg cramps or low back pain. If you develop varicose veins in your legs, wear support hose. Elevate your feet for 15 minutes, 3-4 times a day. Limit salt in your diet. Prenatal Care Schedule your prenatal visits by the   twelfth week of pregnancy. They are usually scheduled monthly at first, then more often in the last 2 months before delivery. Write down your questions. Take them to your prenatal visits. Keep all your prenatal visits as directed by your health care provider. Safety Wear your seat belt at all times when driving. Make a list of emergency phone numbers, including numbers for family, friends, the hospital, and police and fire departments. General Tips Ask your health care provider for a referral to a local prenatal education class. Begin classes no later than at the beginning of month 6 of your pregnancy. Ask for help if you have counseling or nutritional needs during pregnancy. Your health care provider can offer advice or refer you to specialists for help with various needs. Do not use hot tubs, steam rooms, or saunas. Do not douche or use tampons or scented sanitary pads. Do not cross your legs for long periods of time. Avoid cat litter boxes and soil used by cats. These carry germs that can cause birth defects in the baby and possibly loss of the fetus by miscarriage or stillbirth. Avoid all smoking, herbs, alcohol, and medicines not prescribed by your health care provider. Chemicals in these affect the formation and growth of the baby. Schedule a dentist appointment. At home, brush your teeth with a soft toothbrush and be gentle when you floss. SEEK MEDICAL CARE IF:   You have dizziness. You have mild pelvic cramps, pelvic pressure, or nagging pain in the abdominal area. You have persistent nausea, vomiting, or diarrhea. You have a bad smelling vaginal discharge. You have pain with urination. You notice increased swelling in your face, hands, legs, or ankles. SEEK IMMEDIATE MEDICAL CARE IF:  You have a fever. You are leaking fluid from your vagina. You have spotting or bleeding from your vagina. You have severe abdominal cramping or pain. You have rapid weight gain or loss. You vomit blood or material that looks like coffee grounds. You are exposed to German measles and have never had them. You are exposed to fifth disease or chickenpox. You develop a severe headache. You have shortness of breath. You have any kind of trauma, such as from a fall or a car accident. Document Released: 02/23/2001 Document Revised: 07/16/2013 Document Reviewed: 01/09/2013 [employer] Patient Information 2015 [employer]. This information is not intended to replace advice given to you by your health care provider. Make sure you discuss any questions you have with your health care provider.  

## 2023-08-24 NOTE — Progress Notes (Signed)
 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

## 2023-08-24 NOTE — Progress Notes (Signed)
 INITIAL OBSTETRICAL VISIT Patient name: Chelsey Swanson MRN [id number]  Date of birth: Sep 14, 1995 Chief Complaint:   Initial Prenatal Visit  History of Present Illness:   Chelsey Swanson is a 28 y.o. G42P0000 Caucasian female at [redacted]w[redacted]d by LMP c/w u/s at 9.3 weeks with an Estimated Date of Delivery: 02/22/24 being seen today for her initial obstetrical visit.   Patient's last menstrual period was 05/18/2023. Her obstetrical history is significant for primigravida.   Today she reports some low abd cramping/discomfort that Kombucha usually helps.  Last pap July 2020. Results were: negative     08/24/2023    9:43 AM 02/20/2021   10:46 AM  Depression screen PHQ 2/9  Decreased Interest 0 0  Down, Depressed, Hopeless 0 0  PHQ - 2 Score 0 0  Altered sleeping 1   Tired, decreased energy 1   Change in appetite 0   Feeling bad or failure about yourself  0   Trouble concentrating 0   Moving slowly or fidgety/restless 0   Suicidal thoughts 0   PHQ-9 Score 2         08/24/2023    9:43 AM 02/20/2021   10:48 AM  GAD 7 : Generalized Anxiety Score  Nervous, Anxious, on Edge 0 0  Control/stop worrying 0 0  Worry too much - different things 0 0  Trouble relaxing 0 0  Restless 0 0  Easily annoyed or irritable 1 0  Afraid - awful might happen 0 0  Total GAD 7 Score 1 0     Review of Systems:   Pertinent items are noted in HPI Denies cramping/contractions, leakage of fluid, vaginal bleeding, abnormal vaginal discharge w/ itching/odor/irritation, headaches, visual changes, shortness of breath, chest pain, abdominal pain, severe nausea/vomiting, or problems with urination or bowel movements unless otherwise stated above.  Pertinent History Reviewed:  Reviewed past medical,surgical, social, obstetrical and family history.  Reviewed problem list, medications and allergies. OB History  Gravida Para Term Preterm AB Living  1 0 0 0 0 0  SAB IAB Ectopic Multiple Live Births  0 0 0 0 0    #  Outcome Date GA Lbr Len/2nd Weight Sex Type Anes PTL Lv  1 Current            Physical Assessment:   Vitals:   08/24/23 0939  BP: 106/73  Pulse: 98  Weight: 128 lb (58.1 kg)  Body mass index is 20.66 kg/m.       Physical Examination:  General appearance - well appearing, and in no distress  Mental status - alert, oriented to person, place, and time  Psych:  She has a normal mood and affect  Skin - warm and dry, normal color, no suspicious lesions noted  Chest - effort normal, all Swanson fields clear to auscultation bilaterally  Heart - normal rate and regular rhythm  Abdomen - soft, nontender  Extremities:  No swelling or varicosities noted  Pelvic - VULVA: normal appearing vulva with no masses, tenderness or lesions  VAGINA: normal appearing vagina with normal color and discharge, no lesions  CERVIX: normal appearing cervix without discharge or lesions, no CMT  Thin prep pap is done with HR HPV cotesting  Chaperone: Latisha Cresenzo    TODAY'S NT US  14 wks,measurements c/w dates,CRL 83.61 mm,NT 2.7 mm,FHR 147 mm,bilateral avascular cysts in the soft tissue of the neck right 8 x 4 x 8 mm,left 7 x 6 x 4 mm,posterior placenta,normal ovaries,NB present   No  results found for this or any previous visit (from the past 24 hours).  Assessment & Plan:  1) Low-Risk Pregnancy G1P0000 at [redacted]w[redacted]d with an Estimated Date of Delivery: 02/22/24   2) Initial OB visit  3) Stomach upset, Kombucha usually helps; may drink store-bought in small quantities  4) Bilat fetal neck cysts, will have Dr Mellie Sprinkle review u/s and touch base with the patient/make a plan  Meds: No orders of the defined types were placed in this encounter.   Initial labs obtained Continue prenatal vitamins Reviewed n/v relief measures and warning s/s to report Reviewed recommended weight gain based on pre-gravid BMI Encouraged well-balanced diet Genetic & carrier screening discussed: requests Panorama and NT/IT, requests Horizon   Ultrasound discussed; fetal survey: requested CCNC completed> form faxed if has or is planning to apply for medicaid The nature of [place] [facility] [employer] with multiple MDs and other Advanced Practice Providers was explained to patient; also emphasized that fellows, residents, and students are part of our team. Does have home bp cuff. Office bp cuff given: no. Rx sent: n/a. Check bp weekly, let us  know if consistently >140/90.   No indications for ASA therapy (per uptodate)  Follow-up: Return for 4wk LROB & 2nd IT; 6wk LROB & anatomy u/s.   Orders Placed This Encounter  Procedures   Urine Culture   US  OB Comp + 14 Wk   Integrated 1   CBC/D/Plt+RPR+Rh+ABO+RubIgG...   PANORAMA PRENATAL TEST   Hemoglobin A1c   Hgb Fractionation Cascade    Jolayne Natter [facility] 08/24/2023 10:01 AM

## 2023-08-25 LAB — HGB FRACTIONATION CASCADE
Hgb A2: 2.5 % (ref 1.8–3.2)
Hgb A: 97.5 % (ref 96.4–98.8)
Hgb F: 0 % (ref 0.0–2.0)
Hgb S: 0 %

## 2023-08-25 LAB — CBC/D/PLT+RPR+RH+ABO+RUBIGG...
Antibody Screen: NEGATIVE
Basophils Absolute: 0 10*3/uL (ref 0.0–0.2)
Basos: 0 %
EOS (ABSOLUTE): 0 10*3/uL (ref 0.0–0.4)
Eos: 0 %
HCV Ab: NONREACTIVE
HIV Screen 4th Generation wRfx: NONREACTIVE
Hematocrit: 35.8 % (ref 34.0–46.6)
Hemoglobin: 11.8 g/dL (ref 11.1–15.9)
Hepatitis B Surface Ag: NEGATIVE
Immature Grans (Abs): 0 10*3/uL (ref 0.0–0.1)
Immature Granulocytes: 0 %
Lymphocytes Absolute: 1.7 10*3/uL (ref 0.7–3.1)
Lymphs: 20 %
MCH: 30.6 pg (ref 26.6–33.0)
MCHC: 33 g/dL (ref 31.5–35.7)
MCV: 93 fL (ref 79–97)
Monocytes Absolute: 0.5 10*3/uL (ref 0.1–0.9)
Monocytes: 6 %
Neutrophils Absolute: 6.3 10*3/uL (ref 1.4–7.0)
Neutrophils: 73 %
Platelets: 221 10*3/uL (ref 150–450)
RBC: 3.85 x10E6/uL (ref 3.77–5.28)
RDW: 12.2 % (ref 11.7–15.4)
RPR Ser Ql: NONREACTIVE
Rh Factor: POSITIVE
Rubella Antibodies, IGG: 2.07 {index} (ref >0.99)
WBC: 8.6 10*3/uL (ref 3.4–10.8)

## 2023-08-25 LAB — HEMOGLOBIN A1C
Est. average glucose Bld gHb Est-mCnc: 100 mg/dL
Hgb A1c MFr Bld: 5.1 % (ref 4.8–5.6)

## 2023-08-25 LAB — INTEGRATED 1

## 2023-08-25 LAB — HCV INTERPRETATION

## 2023-08-27 LAB — URINE CULTURE

## 2023-08-29 LAB — CYTOLOGY - PAP
Chlamydia: NEGATIVE
Comment: NEGATIVE
Comment: NEGATIVE
Comment: NEGATIVE
Comment: NEGATIVE
Comment: NORMAL
Diagnosis: NEGATIVE
HPV 16: NEGATIVE
HPV 18 / 45: NEGATIVE
High risk HPV: POSITIVE — AB
Neisseria Gonorrhea: NEGATIVE

## 2023-08-31 LAB — PANORAMA PRENATAL TEST FULL PANEL:PANORAMA TEST PLUS 5 ADDITIONAL MICRODELETIONS: FETAL FRACTION: 8.9

## 2023-09-02 ENCOUNTER — Ambulatory Visit: Payer: Self-pay | Admitting: Advanced Practice Midwife

## 2023-09-21 ENCOUNTER — Telehealth (INDEPENDENT_AMBULATORY_CARE_PROVIDER_SITE_OTHER): Admitting: Women's Health

## 2023-09-21 ENCOUNTER — Encounter: Payer: Self-pay | Admitting: Women's Health

## 2023-09-21 VITALS — BP 114/67 | HR 81 | Wt 128.0 lb

## 2023-09-21 DIAGNOSIS — Z3402 Encounter for supervision of normal first pregnancy, second trimester: Secondary | ICD-10-CM | POA: Diagnosis not present

## 2023-09-21 DIAGNOSIS — Z3A18 18 weeks gestation of pregnancy: Secondary | ICD-10-CM | POA: Diagnosis not present

## 2023-09-21 NOTE — Patient Instructions (Signed)
Chelsey Swanson, thank you for choosing our office today! We appreciate the opportunity to meet your healthcare needs. You may receive a short survey by mail, e-mail, or through [employer]. If you are happy with your care we would appreciate if you could take just a few minutes to complete the survey questions. We read all of your comments and take your feedback very seriously. Thank you again for choosing our office.  [facility] [employer] Team at [facility] [address] [facility] ([address] [place], [place] [address]) Entrance C, located off of [address] Free 24/7 valet parking  Go to [email or web address] to register for FREE online childbirth classes  Call the office [phone]) or go to [facility] if: You begin to severe cramping Your water breaks.  Sometimes it is a big gush of fluid, sometimes it is just a trickle that keeps getting your panties wet or running down your legs You have vaginal bleeding.  It is normal to have a small amount of spotting if your cervix was checked.   [facility] Pediatricians/Family Doctors [facility]): [address] Colette Ribas, [phone]           [facility]: [address], [phone]                [facility]): [address], [phone] (call to ask if accepting patients) [facility] Department: [address], [phone]    [facility] Pediatricians/Family Doctors [facility]): [phone] [address], [phone] [facility]: [address] [place], [phone] [facility]: [address], [phone]  [facility] Doctors  [facility]): [phone] [facility]: [address], [phone]   [facility] Doctors [facility]: [address], [phone]  [facility] Doctors  [facility]: [phone], [phone]  Home Blood Pressure Monitoring for Patients   Your provider has recommended that you check your blood pressure (BP) at least once a week at home. If you do not have a blood pressure cuff at home, one will be provided for you. Contact your provider if you have not received your monitor within 1 week.   Helpful Tips for Accurate Home Blood Pressure Checks  Don't smoke, exercise, or drink caffeine 30 minutes before checking your BP Use the restroom before checking your BP (a full bladder can raise your pressure) Relax in a comfortable upright chair Feet on the ground Left arm resting comfortably on a flat surface at the level of your heart Legs uncrossed Back supported Sit quietly and don't talk Place the cuff on your bare arm Adjust snuggly, so that only two fingertips can fit between your skin and the top of the cuff Check 2 readings separated by at least one minute Keep a log of your BP readings For a visual, please reference this diagram: http://ccnc.care/bpdiagram  Provider Name: [facility]     Phone: [phone]  Zone 1: ALL CLEAR  Continue to monitor your symptoms:  BP reading is less than 140 (top number) or less than 90 (bottom number)  No right upper stomach pain No headaches or seeing spots No feeling nauseated or throwing up No swelling in face and hands  Zone 2: CAUTION Call your doctor's office for any of the following:  BP reading is greater than 140 (top number) or greater than  90 (bottom number)  Stomach pain under your ribs in the middle or right side Headaches or seeing spots Feeling nauseated or throwing up Swelling in face and hands  Zone 3: EMERGENCY  Seek immediate medical care if you have any of the following:  BP reading is greater than160 (top number) or greater than 110 (bottom number) Severe headaches not improving with Tylenol Serious difficulty catching your breath Any worsening symptoms from  Zone 2     Second Trimester of Pregnancy The second trimester is from week 14 through week 27 (months 4 through 6). The second trimester is often a time when you feel your best. Your body has adjusted to being pregnant, and you begin to feel better physically. Usually, morning sickness has lessened or quit completely, you may have more energy, and you may have an increase in appetite. The second trimester is also a time when the fetus is growing rapidly. At the end of the sixth month, the fetus is about 9 inches long and weighs about 1 pounds. You will likely begin to feel the baby move (quickening) between 16 and 20 weeks of pregnancy. Body changes during your second trimester Your body continues to go through many changes during your second trimester. The changes vary from woman to woman. Your weight will continue to increase. You will notice your lower abdomen bulging out. You may begin to get stretch marks on your hips, abdomen, and breasts. You may develop headaches that can be relieved by medicines. The medicines should be approved by your health care provider. You may urinate more often because the fetus is pressing on your bladder. You may develop or continue to have heartburn as a result of your pregnancy. You may develop constipation because certain hormones are causing the muscles that push waste through your intestines to slow down. You may develop hemorrhoids or swollen, bulging veins (varicose veins). You may have back pain. This is caused by: Weight gain. Pregnancy hormones that are relaxing the joints in your pelvis. A shift in weight and the muscles that support your balance. Your breasts will continue to grow and they will continue to become tender. Your gums may bleed and may be sensitive to brushing and flossing. Dark spots or blotches (chloasma, mask of pregnancy) may develop on your face. This will likely fade after the baby is born. A dark line from your belly button to  the pubic area (linea nigra) may appear. This will likely fade after the baby is born. You may have changes in your hair. These can include thickening of your hair, rapid growth, and changes in texture. Some women also have hair loss during or after pregnancy, or hair that feels dry or thin. Your hair will most likely return to normal after your baby is born.  What to expect at prenatal visits During a routine prenatal visit: You will be weighed to make sure you and the fetus are growing normally. Your blood pressure will be taken. Your abdomen will be measured to track your baby's growth. The fetal heartbeat will be listened to. Any test results from the previous visit will be discussed.  Your health care provider may ask you: How you are feeling. If you are feeling the baby move. If you have had any abnormal symptoms, such as leaking fluid, bleeding, severe headaches, or abdominal cramping. If you are using any tobacco products, including cigarettes, chewing tobacco, and electronic cigarettes. If you have any questions.  Other tests that may be performed during  your second trimester include: Blood tests that check for: Low iron levels (anemia). High blood sugar that affects pregnant women (gestational diabetes) between 59 and 28 weeks. Rh antibodies. This is to check for a protein on red blood cells (Rh factor). Urine tests to check for infections, diabetes, or protein in the urine. An ultrasound to confirm the proper growth and development of the baby. An amniocentesis to check for possible genetic problems. Fetal screens for spina bifida and Down syndrome. HIV (human immunodeficiency virus) testing. Routine prenatal testing includes screening for HIV, unless you choose not to have this test.  Follow these instructions at home: Medicines Follow your health care provider's instructions regarding medicine use. Specific medicines may be either safe or unsafe to take during  pregnancy. Take a prenatal vitamin that contains at least 600 micrograms (mcg) of folic acid. If you develop constipation, try taking a stool softener if your health care provider approves. Eating and drinking Eat a balanced diet that includes fresh fruits and vegetables, whole grains, good sources of protein such as meat, eggs, or tofu, and low-fat dairy. Your health care provider will help you determine the amount of weight gain that is right for you. Avoid raw meat and uncooked cheese. These carry germs that can cause birth defects in the baby. If you have low calcium intake from food, talk to your health care provider about whether you should take a daily calcium supplement. Limit foods that are high in fat and processed sugars, such as fried and sweet foods. To prevent constipation: Drink enough fluid to keep your urine clear or pale yellow. Eat foods that are high in fiber, such as fresh fruits and vegetables, whole grains, and beans. Activity Exercise only as directed by your health care provider. Most women can continue their usual exercise routine during pregnancy. Try to exercise for 30 minutes at least 5 days a week. Stop exercising if you experience uterine contractions. Avoid heavy lifting, wear low heel shoes, and practice good posture. A sexual relationship may be continued unless your health care provider directs you otherwise. Relieving pain and discomfort Wear a good support bra to prevent discomfort from breast tenderness. Take warm sitz baths to soothe any pain or discomfort caused by hemorrhoids. Use hemorrhoid cream if your health care provider approves. Rest with your legs elevated if you have leg cramps or low back pain. If you develop varicose veins, wear support hose. Elevate your feet for 15 minutes, 3-4 times a day. Limit salt in your diet. Prenatal Care Write down your questions. Take them to your prenatal visits. Keep all your prenatal visits as told by your health  care provider. This is important. Safety Wear your seat belt at all times when driving. Make a list of emergency phone numbers, including numbers for family, friends, the hospital, and police and fire departments. General instructions Ask your health care provider for a referral to a local prenatal education class. Begin classes no later than the beginning of month 6 of your pregnancy. Ask for help if you have counseling or nutritional needs during pregnancy. Your health care provider can offer advice or refer you to specialists for help with various needs. Do not use hot tubs, steam rooms, or saunas. Do not douche or use tampons or scented sanitary pads. Do not cross your legs for long periods of time. Avoid cat litter boxes and soil used by cats. These carry germs that can cause birth defects in the baby and possibly loss of the  fetus by miscarriage or stillbirth. Avoid all smoking, herbs, alcohol, and unprescribed drugs. Chemicals in these products can affect the formation and growth of the baby. Do not use any products that contain nicotine or tobacco, such as cigarettes and e-cigarettes. If you need help quitting, ask your health care provider. Visit your dentist if you have not gone yet during your pregnancy. Use a soft toothbrush to brush your teeth and be gentle when you floss. Contact a health care provider if: You have dizziness. You have mild pelvic cramps, pelvic pressure, or nagging pain in the abdominal area. You have persistent nausea, vomiting, or diarrhea. You have a bad smelling vaginal discharge. You have pain when you urinate. Get help right away if: You have a fever. You are leaking fluid from your vagina. You have spotting or bleeding from your vagina. You have severe abdominal cramping or pain. You have rapid weight gain or weight loss. You have shortness of breath with chest pain. You notice sudden or extreme swelling of your face, hands, ankles, feet, or legs. You  have not felt your baby move in over an hour. You have severe headaches that do not go away when you take medicine. You have vision changes. Summary The second trimester is from week 14 through week 27 (months 4 through 6). It is also a time when the fetus is growing rapidly. Your body goes through many changes during pregnancy. The changes vary from woman to woman. Avoid all smoking, herbs, alcohol, and unprescribed drugs. These chemicals affect the formation and growth your baby. Do not use any tobacco products, such as cigarettes, chewing tobacco, and e-cigarettes. If you need help quitting, ask your health care provider. Contact your health care provider if you have any questions. Keep all prenatal visits as told by your health care provider. This is important. This information is not intended to replace advice given to you by your health care provider. Make sure you discuss any questions you have with your health care provider. Document Released: 02/23/2001 Document Revised: 08/07/2015 Document Reviewed: 05/02/2012 Elsevier Interactive Patient Education  2017 [employer].

## 2023-09-21 NOTE — Progress Notes (Signed)
 TELEHEALTH VIRTUAL OBSTETRICS VISIT ENCOUNTER NOTE Patient name: Chelsey Swanson MRN [id number]  Date of birth: [birth year]  I connected with patient on 09/21/23 at  4:10 PM EDT by MyChart video  and verified that I am speaking with the correct person using two identifiers. Pt is not currently in our office, she is in car.  The provider is in the office.    I discussed the limitations, risks, security and privacy concerns of performing an evaluation and management service by telephone and the availability of in person appointments. I also discussed with the patient that there may be a patient responsible charge related to this service. The patient expressed understanding and agreed to proceed.  Chief Complaint:   Routine Prenatal Visit (MY Chart VIdeo)  History of Present Illness:   MIKE BERNTSEN is a 28 y.o. G1P0000 female at [redacted]w[redacted]d with an Estimated Date of Delivery: 02/22/24 being evaluated today for ongoing management of a low-risk pregnancy.     08/24/2023    9:43 AM 02/20/2021   10:46 AM  Depression screen PHQ 2/9  Decreased Interest 0 0  Down, Depressed, Hopeless 0 0  PHQ - 2 Score 0 0  Altered sleeping 1   Tired, decreased energy 1   Change in appetite 0   Feeling bad or failure about yourself  0   Trouble concentrating 0   Moving slowly or fidgety/restless 0   Suicidal thoughts 0   PHQ-9 Score 2     Today she reports no complaints. Contractions: Not present.  .  Movement: Absent. denies leaking of fluid. Review of Systems:   Pertinent items are noted in HPI Denies abnormal vaginal discharge w/ itching/odor/irritation, headaches, visual changes, shortness of breath, chest pain, abdominal pain, severe nausea/vomiting, or problems with urination or bowel movements unless otherwise stated above. Pertinent History Reviewed:  Reviewed past medical,surgical, social, obstetrical and family history.  Reviewed problem list, medications and allergies. Physical Assessment:    Vitals:   09/21/23 1608  BP: 114/67  Pulse: 81  Weight: 128 lb (58.1 kg)  Body mass index is 20.66 kg/m.        Physical Examination:   General:  Alert, oriented and cooperative.   Mental Status: Normal mood and affect perceived. Normal judgment and thought content.  Rest of physical exam deferred due to type of encounter  No results found for this or any previous visit (from the past 24 hours).  Assessment & Plan:  1) Pregnancy G1P0000 at [redacted]w[redacted]d with an Estimated Date of Delivery: 02/22/24   2) Fetal bilateral cervical cysts, f/u at anatomy u/s   Meds: No orders of the defined types were placed in this encounter.   Labs/procedures today: none  Plan:  Continue routine obstetrical care.  Does have home bp cuff. Office bp cuff given: not applicable. Check bp weekly, let us  know if consistently >140 and/or >90.  Next visit: prefers will be in person for u/s    Reviewed: Preterm labor symptoms and general obstetric precautions including but not limited to vaginal bleeding, contractions, leaking of fluid and fetal movement were reviewed in detail with the patient. The patient was advised to call back or seek an in-person office evaluation/go to [facility] for any urgent or concerning symptoms. All questions were answered. Please refer to After Visit Summary for other counseling recommendations.    I provided 10 minutes of non-face-to-face time during this encounter.  Follow-up: Return for As scheduled.  No orders of  the defined types were placed in this encounter.  Suzen JONELLE Fetters CNM, [facility] 09/21/2023 4:14 PM

## 2023-10-05 ENCOUNTER — Ambulatory Visit (INDEPENDENT_AMBULATORY_CARE_PROVIDER_SITE_OTHER): Admitting: Women's Health

## 2023-10-05 ENCOUNTER — Ambulatory Visit

## 2023-10-05 ENCOUNTER — Encounter: Payer: Self-pay | Admitting: Women's Health

## 2023-10-05 VITALS — BP 100/64 | HR 80 | Wt 132.0 lb

## 2023-10-05 DIAGNOSIS — Z3402 Encounter for supervision of normal first pregnancy, second trimester: Secondary | ICD-10-CM

## 2023-10-05 DIAGNOSIS — O283 Abnormal ultrasonic finding on antenatal screening of mother: Secondary | ICD-10-CM | POA: Diagnosis not present

## 2023-10-05 DIAGNOSIS — Z3A20 20 weeks gestation of pregnancy: Secondary | ICD-10-CM

## 2023-10-05 DIAGNOSIS — Z363 Encounter for antenatal screening for malformations: Secondary | ICD-10-CM | POA: Diagnosis not present

## 2023-10-05 NOTE — Progress Notes (Signed)
 LOW-RISK PREGNANCY VISIT Patient name: Chelsey Swanson MRN [id number]  Date of birth: 1995/06/13 Chief Complaint:   Routine Prenatal Visit  History of Present Illness:   Chelsey Swanson is a 28 y.o. G1P0000 female at [redacted]w[redacted]d with an Estimated Date of Delivery: 02/22/24 being seen today for ongoing management of a low-risk pregnancy.   Today she reports no complaints. Contractions: Not present. Vag. Bleeding: None.  Movement: Present. denies leaking of fluid.     08/24/2023    9:43 AM 02/20/2021   10:46 AM  Depression screen PHQ 2/9  Decreased Interest 0 0  Down, Depressed, Hopeless 0 0  PHQ - 2 Score 0 0  Altered sleeping 1   Tired, decreased energy 1   Change in appetite 0   Feeling bad or failure about yourself  0   Trouble concentrating 0   Moving slowly or fidgety/restless 0   Suicidal thoughts 0   PHQ-9 Score 2         08/24/2023    9:43 AM 02/20/2021   10:48 AM  GAD 7 : Generalized Anxiety Score  Nervous, Anxious, on Edge 0 0  Control/stop worrying 0 0  Worry too much - different things 0 0  Trouble relaxing 0 0  Restless 0 0  Easily annoyed or irritable 1 0  Afraid - awful might happen 0 0  Total GAD 7 Score 1 0      Review of Systems:   Pertinent items are noted in HPI Denies abnormal vaginal discharge w/ itching/odor/irritation, headaches, visual changes, shortness of breath, chest pain, abdominal pain, severe nausea/vomiting, or problems with urination or bowel movements unless otherwise stated above. Pertinent History Reviewed:  Reviewed past medical,surgical, social, obstetrical and family history.  Reviewed problem list, medications and allergies. Physical Assessment:   Vitals:   10/05/23 0917  BP: 100/64  Pulse: 80  Weight: 132 lb (59.9 kg)  Body mass index is 21.31 kg/m.        Physical Examination:   General appearance: Well appearing, and in no distress  Mental status: Alert, oriented to person, place, and time  Skin: Warm &  dry  Cardiovascular: Normal heart rate noted  Respiratory: Normal respiratory effort, no distress  Abdomen: Soft, gravid, nontender  Pelvic: Cervical exam deferred         Extremities:    Fetal Status:     Movement: Present   US  20 wks,cephalic,posterior placenta gr 0,CX 2.9 cm,SVP of fluid 4.8 cm,FHR 156 bpm,bilateral neck fluid not visualized on today's US ,thickened NT 7.9 mm,EFW 317 g 37%,anatomy complete   Chaperone: N/A No results found for this or any previous visit (from the past 24 hours).  Assessment & Plan:  1) Low-risk pregnancy G1P0000 at [redacted]w[redacted]d with an Estimated Date of Delivery: 02/22/24   2) Thickened NT, 7.41mm, LR NIPS, MFM referral   Meds: No orders of the defined types were placed in this encounter.  Labs/procedures today: U/S and 2nd IT  Plan:  Continue routine obstetrical care  Next visit: prefers in person    Reviewed: Preterm labor symptoms and general obstetric precautions including but not limited to vaginal bleeding, contractions, leaking of fluid and fetal movement were reviewed in detail with the patient.  All questions were answered. Does have home bp cuff. Office bp cuff given: not applicable. Check bp weekly, let us  know if consistently >140 and/or >90.  Follow-up: Return in about 4 weeks (around 11/02/2023) for LROB, MD, in person.  Future Appointments  Date Time Provider Department Center  11/04/2023  8:50 AM Eure, Vonn DEL, MD [facility]    Orders Placed This Encounter  Procedures   US  MFM OB DETAIL +14 WK   INTEGRATED 2   Suzen JONELLE Fetters CNM, [facility] 10/05/2023 10:03 AM

## 2023-10-05 NOTE — Progress Notes (Signed)
 US  20 wks,cephalic,posterior placenta gr 0,CX 2.9 cm,SVP of fluid 4.8 cm,FHR 156 bpm,bilateral neck fluid not visualized on today's US ,thickened NT 7.9 mm,EFW 317 g 37%,anatomy complete

## 2023-10-05 NOTE — Patient Instructions (Signed)
Chelsey Swanson, thank you for choosing our office today! We appreciate the opportunity to meet your healthcare needs. You may receive a short survey by mail, e-mail, or through [employer]. If you are happy with your care we would appreciate if you could take just a few minutes to complete the survey questions. We read all of your comments and take your feedback very seriously. Thank you again for choosing our office.  [facility] [employer] Team at [facility] [address] [facility] ([address] [place], [place] [address]) Entrance C, located off of [address] Free 24/7 valet parking  Go to [email or web address] to register for FREE online childbirth classes  Call the office [phone]) or go to [facility] if: You begin to severe cramping Your water breaks.  Sometimes it is a big gush of fluid, sometimes it is just a trickle that keeps getting your panties wet or running down your legs You have vaginal bleeding.  It is normal to have a small amount of spotting if your cervix was checked.   [facility] Pediatricians/Family Doctors [facility]): [address] Colette Ribas, [phone]           [facility]: [address], [phone]                [facility]): [address], [phone] (call to ask if accepting patients) [facility] Department: [address], [phone]    [facility] Pediatricians/Family Doctors [facility]): [phone] [address], [phone] [facility]: [address] [place], [phone] [facility]: [address], [phone]  [facility] Doctors  [facility]): [phone] [facility]: [address], [phone]   [facility] Doctors [facility]: [address], [phone]  [facility] Doctors  [facility]: [phone], [phone]  Home Blood Pressure Monitoring for Patients   Your provider has recommended that you check your blood pressure (BP) at least once a week at home. If you do not have a blood pressure cuff at home, one will be provided for you. Contact your provider if you have not received your monitor within 1 week.   Helpful Tips for Accurate Home Blood Pressure Checks  Don't smoke, exercise, or drink caffeine 30 minutes before checking your BP Use the restroom before checking your BP (a full bladder can raise your pressure) Relax in a comfortable upright chair Feet on the ground Left arm resting comfortably on a flat surface at the level of your heart Legs uncrossed Back supported Sit quietly and don't talk Place the cuff on your bare arm Adjust snuggly, so that only two fingertips can fit between your skin and the top of the cuff Check 2 readings separated by at least one minute Keep a log of your BP readings For a visual, please reference this diagram: http://ccnc.care/bpdiagram  Provider Name: [facility]     Phone: [phone]  Zone 1: ALL CLEAR  Continue to monitor your symptoms:  BP reading is less than 140 (top number) or less than 90 (bottom number)  No right upper stomach pain No headaches or seeing spots No feeling nauseated or throwing up No swelling in face and hands  Zone 2: CAUTION Call your doctor's office for any of the following:  BP reading is greater than 140 (top number) or greater than  90 (bottom number)  Stomach pain under your ribs in the middle or right side Headaches or seeing spots Feeling nauseated or throwing up Swelling in face and hands  Zone 3: EMERGENCY  Seek immediate medical care if you have any of the following:  BP reading is greater than160 (top number) or greater than 110 (bottom number) Severe headaches not improving with Tylenol Serious difficulty catching your breath Any worsening symptoms from  Zone 2     Second Trimester of Pregnancy The second trimester is from week 14 through week 27 (months 4 through 6). The second trimester is often a time when you feel your best. Your body has adjusted to being pregnant, and you begin to feel better physically. Usually, morning sickness has lessened or quit completely, you may have more energy, and you may have an increase in appetite. The second trimester is also a time when the fetus is growing rapidly. At the end of the sixth month, the fetus is about 9 inches long and weighs about 1 pounds. You will likely begin to feel the baby move (quickening) between 16 and 20 weeks of pregnancy. Body changes during your second trimester Your body continues to go through many changes during your second trimester. The changes vary from woman to woman. Your weight will continue to increase. You will notice your lower abdomen bulging out. You may begin to get stretch marks on your hips, abdomen, and breasts. You may develop headaches that can be relieved by medicines. The medicines should be approved by your health care provider. You may urinate more often because the fetus is pressing on your bladder. You may develop or continue to have heartburn as a result of your pregnancy. You may develop constipation because certain hormones are causing the muscles that push waste through your intestines to slow down. You may develop hemorrhoids or swollen, bulging veins (varicose veins). You may have back pain. This is caused by: Weight gain. Pregnancy hormones that are relaxing the joints in your pelvis. A shift in weight and the muscles that support your balance. Your breasts will continue to grow and they will continue to become tender. Your gums may bleed and may be sensitive to brushing and flossing. Dark spots or blotches (chloasma, mask of pregnancy) may develop on your face. This will likely fade after the baby is born. A dark line from your belly button to  the pubic area (linea nigra) may appear. This will likely fade after the baby is born. You may have changes in your hair. These can include thickening of your hair, rapid growth, and changes in texture. Some women also have hair loss during or after pregnancy, or hair that feels dry or thin. Your hair will most likely return to normal after your baby is born.  What to expect at prenatal visits During a routine prenatal visit: You will be weighed to make sure you and the fetus are growing normally. Your blood pressure will be taken. Your abdomen will be measured to track your baby's growth. The fetal heartbeat will be listened to. Any test results from the previous visit will be discussed.  Your health care provider may ask you: How you are feeling. If you are feeling the baby move. If you have had any abnormal symptoms, such as leaking fluid, bleeding, severe headaches, or abdominal cramping. If you are using any tobacco products, including cigarettes, chewing tobacco, and electronic cigarettes. If you have any questions.  Other tests that may be performed during  your second trimester include: Blood tests that check for: Low iron levels (anemia). High blood sugar that affects pregnant women (gestational diabetes) between 59 and 28 weeks. Rh antibodies. This is to check for a protein on red blood cells (Rh factor). Urine tests to check for infections, diabetes, or protein in the urine. An ultrasound to confirm the proper growth and development of the baby. An amniocentesis to check for possible genetic problems. Fetal screens for spina bifida and Down syndrome. HIV (human immunodeficiency virus) testing. Routine prenatal testing includes screening for HIV, unless you choose not to have this test.  Follow these instructions at home: Medicines Follow your health care provider's instructions regarding medicine use. Specific medicines may be either safe or unsafe to take during  pregnancy. Take a prenatal vitamin that contains at least 600 micrograms (mcg) of folic acid. If you develop constipation, try taking a stool softener if your health care provider approves. Eating and drinking Eat a balanced diet that includes fresh fruits and vegetables, whole grains, good sources of protein such as meat, eggs, or tofu, and low-fat dairy. Your health care provider will help you determine the amount of weight gain that is right for you. Avoid raw meat and uncooked cheese. These carry germs that can cause birth defects in the baby. If you have low calcium intake from food, talk to your health care provider about whether you should take a daily calcium supplement. Limit foods that are high in fat and processed sugars, such as fried and sweet foods. To prevent constipation: Drink enough fluid to keep your urine clear or pale yellow. Eat foods that are high in fiber, such as fresh fruits and vegetables, whole grains, and beans. Activity Exercise only as directed by your health care provider. Most women can continue their usual exercise routine during pregnancy. Try to exercise for 30 minutes at least 5 days a week. Stop exercising if you experience uterine contractions. Avoid heavy lifting, wear low heel shoes, and practice good posture. A sexual relationship may be continued unless your health care provider directs you otherwise. Relieving pain and discomfort Wear a good support bra to prevent discomfort from breast tenderness. Take warm sitz baths to soothe any pain or discomfort caused by hemorrhoids. Use hemorrhoid cream if your health care provider approves. Rest with your legs elevated if you have leg cramps or low back pain. If you develop varicose veins, wear support hose. Elevate your feet for 15 minutes, 3-4 times a day. Limit salt in your diet. Prenatal Care Write down your questions. Take them to your prenatal visits. Keep all your prenatal visits as told by your health  care provider. This is important. Safety Wear your seat belt at all times when driving. Make a list of emergency phone numbers, including numbers for family, friends, the hospital, and police and fire departments. General instructions Ask your health care provider for a referral to a local prenatal education class. Begin classes no later than the beginning of month 6 of your pregnancy. Ask for help if you have counseling or nutritional needs during pregnancy. Your health care provider can offer advice or refer you to specialists for help with various needs. Do not use hot tubs, steam rooms, or saunas. Do not douche or use tampons or scented sanitary pads. Do not cross your legs for long periods of time. Avoid cat litter boxes and soil used by cats. These carry germs that can cause birth defects in the baby and possibly loss of the  fetus by miscarriage or stillbirth. Avoid all smoking, herbs, alcohol, and unprescribed drugs. Chemicals in these products can affect the formation and growth of the baby. Do not use any products that contain nicotine or tobacco, such as cigarettes and e-cigarettes. If you need help quitting, ask your health care provider. Visit your dentist if you have not gone yet during your pregnancy. Use a soft toothbrush to brush your teeth and be gentle when you floss. Contact a health care provider if: You have dizziness. You have mild pelvic cramps, pelvic pressure, or nagging pain in the abdominal area. You have persistent nausea, vomiting, or diarrhea. You have a bad smelling vaginal discharge. You have pain when you urinate. Get help right away if: You have a fever. You are leaking fluid from your vagina. You have spotting or bleeding from your vagina. You have severe abdominal cramping or pain. You have rapid weight gain or weight loss. You have shortness of breath with chest pain. You notice sudden or extreme swelling of your face, hands, ankles, feet, or legs. You  have not felt your baby move in over an hour. You have severe headaches that do not go away when you take medicine. You have vision changes. Summary The second trimester is from week 14 through week 27 (months 4 through 6). It is also a time when the fetus is growing rapidly. Your body goes through many changes during pregnancy. The changes vary from woman to woman. Avoid all smoking, herbs, alcohol, and unprescribed drugs. These chemicals affect the formation and growth your baby. Do not use any tobacco products, such as cigarettes, chewing tobacco, and e-cigarettes. If you need help quitting, ask your health care provider. Contact your health care provider if you have any questions. Keep all prenatal visits as told by your health care provider. This is important. This information is not intended to replace advice given to you by your health care provider. Make sure you discuss any questions you have with your health care provider. Document Released: 02/23/2001 Document Revised: 08/07/2015 Document Reviewed: 05/02/2012 Elsevier Interactive Patient Education  2017 [employer].

## 2023-10-07 ENCOUNTER — Encounter: Payer: Self-pay | Admitting: Women's Health

## 2023-10-07 LAB — INTEGRATED 2
AFP MoM: 1.13
Alpha-Fetoprotein: 65.2 ng/mL
Crown Rump Length: 83.5 mm
DIA MoM: 0.78
DIA Value: 158.5 pg/mL
Estriol, Unconjugated: 2.76 ng/mL
Gest. Age on Collection Date: 13.9 wk
Gestational Age: 19.9 wk
Maternal Age at EDD: 27.4 a
Nuchal Translucency (NT): 2.7 mm
Nuchal Translucency MoM: 1.12
Number of Fetuses: 1
PAPP-A MoM: 1.59
PAPP-A Value: 2350.5 ng/mL
Sonographer ID#: 309760
Test Results:: NEGATIVE
Weight: 128 [lb_av]
Weight: 128 [lb_av]
hCG MoM: 1.24
hCG Value: 31.7 [IU]/mL
uE3 MoM: 1.13

## 2023-10-11 ENCOUNTER — Ambulatory Visit: Payer: Self-pay | Admitting: Women's Health

## 2023-10-20 ENCOUNTER — Encounter: Payer: Self-pay | Admitting: Women's Health

## 2023-11-04 ENCOUNTER — Ambulatory Visit: Admitting: Obstetrics & Gynecology

## 2023-11-04 VITALS — BP 111/69 | HR 82 | Wt 144.0 lb

## 2023-11-04 DIAGNOSIS — O0992 Supervision of high risk pregnancy, unspecified, second trimester: Secondary | ICD-10-CM

## 2023-11-04 DIAGNOSIS — O283 Abnormal ultrasonic finding on antenatal screening of mother: Secondary | ICD-10-CM | POA: Diagnosis not present

## 2023-11-04 DIAGNOSIS — Z3A24 24 weeks gestation of pregnancy: Secondary | ICD-10-CM | POA: Diagnosis not present

## 2023-11-04 MED ORDER — OMEPRAZOLE 20 MG PO CPDR: 20.0000 mg | DELAYED_RELEASE_CAPSULE | Freq: Every day | ORAL | 6 refills | Status: AC

## 2023-11-04 NOTE — Progress Notes (Signed)
 HIGH-RISK PREGNANCY VISIT Patient name: Chelsey Swanson MRN [id number]  Date of birth: 06-18-1995 Chief Complaint:   Routine Prenatal Visit  History of Present Illness:   Chelsey Swanson is a 28 y.o. G61P0000 female at [redacted]w[redacted]d with an Estimated Date of Delivery: 02/22/24 being seen today for ongoing management of a high-risk pregnancy complicated by     ICD-10-CM   1. Supervision of high risk pregnancy in second trimester  O09.92     2. Increased nuchal translucency space on fetal ultrasound: female, isolated finding, MFM scan 1 week  O28.3     3. [redacted] weeks gestation of pregnancy  Z3A.24      .    Today she reports no complaints. Contractions: Not present. Vag. Bleeding: None.  Movement: Present. denies leaking of fluid.      08/24/2023    9:43 AM 02/20/2021   10:46 AM  Depression screen PHQ 2/9  Decreased Interest 0 0  Down, Depressed, Hopeless 0 0  PHQ - 2 Score 0 0  Altered sleeping 1   Tired, decreased energy 1   Change in appetite 0   Feeling bad or failure about yourself  0   Trouble concentrating 0   Moving slowly or fidgety/restless 0   Suicidal thoughts 0   PHQ-9 Score 2         08/24/2023    9:43 AM 02/20/2021   10:48 AM  GAD 7 : Generalized Anxiety Score  Nervous, Anxious, on Edge 0 0  Control/stop worrying 0 0  Worry too much - different things 0 0  Trouble relaxing 0 0  Restless 0 0  Easily annoyed or irritable 1 0  Afraid - awful might happen 0 0  Total GAD 7 Score 1 0     Review of Systems:   Pertinent items are noted in HPI Denies abnormal vaginal discharge w/ itching/odor/irritation, headaches, visual changes, shortness of breath, chest pain, abdominal pain, severe nausea/vomiting, or problems with urination or bowel movements unless otherwise stated above. Pertinent History Reviewed:  Reviewed past medical,surgical, social, obstetrical and family history.  Reviewed problem list, medications and allergies. Physical Assessment:   Vitals:    11/04/23 0853  BP: 111/69  Pulse: 82  Weight: 144 lb (65.3 kg)  Body mass index is 23.24 kg/m.           Physical Examination:   General appearance: alert, well appearing, and in no distress  Mental status: alert, oriented to person, place, and time  Skin: warm & dry   Extremities:      Cardiovascular: normal heart rate noted  Respiratory: normal respiratory effort, no distress  Abdomen: gravid, soft, non-tender  Pelvic: Cervical exam deferred         Fetal Status:     Movement: Present    Fetal Surveillance Testing today: FHR 150   Chaperone:     No results found for this or any previous visit (from the past 24 hours).  Assessment & Plan:  High-risk pregnancy: G1P0000 at [redacted]w[redacted]d with an Estimated Date of Delivery: 02/22/24      ICD-10-CM   1. Supervision of high risk pregnancy in second trimester  O09.92     2. Increased nuchal translucency space on fetal ultrasound: female, isolated finding, MFM scan 1 week  O28.3     3. [redacted] weeks gestation of pregnancy  Z3A.24          Meds:  Meds ordered this encounter  Medications   omeprazole  (PRILOSEC)  20 MG capsule    Sig: Take 1 capsule (20 mg total) by mouth daily. 1 tablet a day    Dispense:  30 capsule    Refill:  6    Orders: No orders of the defined types were placed in this encounter.    Labs/procedures today: none  Treatment Plan:  PN2 next visit, MFM sonogram 1 week   Follow-up: Return in about 4 weeks (around 12/02/2023) for PN2.   Future Appointments  Date Time Provider Department Center  11/11/2023  8:00 AM [facility] PROVIDER 1 [facility]  11/11/2023  8:30 AM [facility] US5 [facility]    No orders of the defined types were placed in this encounter.  Vonn VEAR Inch  Attending Physician for the Center for [facility] 11/04/2023 9:40 AM

## 2023-11-11 ENCOUNTER — Ambulatory Visit: Attending: Obstetrics and Gynecology | Admitting: Maternal & Fetal Medicine

## 2023-11-11 ENCOUNTER — Ambulatory Visit

## 2023-11-11 VITALS — BP 127/63 | HR 88

## 2023-11-11 DIAGNOSIS — Z363 Encounter for antenatal screening for malformations: Secondary | ICD-10-CM | POA: Insufficient documentation

## 2023-11-11 DIAGNOSIS — Z3A25 25 weeks gestation of pregnancy: Secondary | ICD-10-CM | POA: Diagnosis not present

## 2023-11-11 DIAGNOSIS — O283 Abnormal ultrasonic finding on antenatal screening of mother: Secondary | ICD-10-CM

## 2023-11-11 DIAGNOSIS — Z3402 Encounter for supervision of normal first pregnancy, second trimester: Secondary | ICD-10-CM

## 2023-11-11 DIAGNOSIS — O289 Unspecified abnormal findings on antenatal screening of mother: Secondary | ICD-10-CM | POA: Diagnosis not present

## 2023-11-11 NOTE — Progress Notes (Signed)
   Patient information  Patient Name: Chelsey Swanson  Patient MRN:   [id number]  Referring practice: MFM Referring Provider: [facility]  Problem List   Patient Active Problem List   Diagnosis Date Noted  . Abnormal fetal ultrasound (fetal neck cysts) 10/05/2023  . Supervision of normal first pregnancy 08/23/2023    Maternal Fetal medicine Consult  Chelsey Swanson is a 28 y.o. G1P0000 at [redacted]w[redacted]d here for ultrasound and consultation. Chelsey Swanson is doing well today.  She had low risk aneuploidy screening with female fetus.  Earlier this pregnancy there was concern for bilateral dilated anterior neck cysts.  At the anatomy scan there was a questionable nuchal fold thickness.  We discussed this could have represented a dilated jugular sac bilaterally or distention of some of the lymphatic system.   Since the fetus is female, if there was evidence of a cystic hygroma this could be representative of Noonan syndrome.  However, there is no evidence of this on today's ultrasound.  I discussed that there is a low chance for anatomic or genetic cause for this but the only way to know prior to birth to be an amniocentesis.   We discussed the risks versus the benefits and usages at this time.  After discussion the patient declined.  She will return to her OB provider for the remainder of her prenatal care.  The patient had time to ask questions that were answered to her satisfaction.  She verbalized understanding and agrees to proceed with the plan below.  Sonographic findings Single intrauterine pregnancy at 25w 2d  Fetal cardiac activity:  Observed and appears normal. Presentation: Cephalic. The anatomic structures that were well seen appear normal without evidence of soft markers. Due to poor acoustic windows some structures remain suboptimally visualized. Fetal biometry shows the estimated fetal weight at the 43 percentile.  Amniotic fluid: Within normal limits.  MVP: 6.12 cm. Placenta:  Posterior. Adnexa: No abnormality visualized. Cervical length: 4.3 cm.  There are limitations of prenatal ultrasound such as the inability to detect certain abnormalities due to poor visualization. Various factors such as fetal position, gestational age and maternal body habitus may increase the difficulty in visualizing the fetal anatomy.    Recommendations -EDD should be 02/22/2024 based on  LMP  (05/18/23). -F/u with OB provider.   Review of Systems: A review of systems was performed and was negative except per HPI   Vitals and Physical Exam    11/11/2023    7:58 AM 11/04/2023    8:53 AM 10/05/2023    9:17 AM  Vitals with BMI  Weight  144 lbs 132 lbs  Systolic 127 111 899  Diastolic 63 69 64  Pulse 88 82 80    Sitting comfortably on the sonogram table Nonlabored breathing Normal rate and rhythm Abdomen is nontender  Past pregnancies OB History  Gravida Para Term Preterm AB Living  1 0 0 0 0 0  SAB IAB Ectopic Multiple Live Births  0 0 0 0 0    # Outcome Date GA Lbr Len/2nd Weight Sex Type Anes PTL Lv  1 Current              I spent 45 minutes reviewing the patients chart, including labs and images as well as counseling the patient about her medical conditions. Greater than 50% of the time was spent in direct face-to-face patient counseling.  Chelsey Swanson  MFM, [facility]   11/16/2023  8:35 AM

## 2023-12-02 ENCOUNTER — Encounter: Admitting: Obstetrics & Gynecology

## 2023-12-02 ENCOUNTER — Other Ambulatory Visit

## 2023-12-02 DIAGNOSIS — Z131 Encounter for screening for diabetes mellitus: Secondary | ICD-10-CM

## 2023-12-02 DIAGNOSIS — O0993 Supervision of high risk pregnancy, unspecified, third trimester: Secondary | ICD-10-CM

## 2023-12-02 DIAGNOSIS — Z3A28 28 weeks gestation of pregnancy: Secondary | ICD-10-CM

## 2023-12-05 DIAGNOSIS — Z3480 Encounter for supervision of other normal pregnancy, unspecified trimester: Secondary | ICD-10-CM | POA: Diagnosis not present

## 2023-12-05 DIAGNOSIS — Z23 Encounter for immunization: Secondary | ICD-10-CM | POA: Diagnosis not present

## 2023-12-05 DIAGNOSIS — Z113 Encounter for screening for infections with a predominantly sexual mode of transmission: Secondary | ICD-10-CM | POA: Diagnosis not present

## 2023-12-19 DIAGNOSIS — Z369 Encounter for antenatal screening, unspecified: Secondary | ICD-10-CM | POA: Diagnosis not present

## 2024-01-02 DIAGNOSIS — Z369 Encounter for antenatal screening, unspecified: Secondary | ICD-10-CM | POA: Diagnosis not present

## 2024-01-17 DIAGNOSIS — Z369 Encounter for antenatal screening, unspecified: Secondary | ICD-10-CM | POA: Diagnosis not present

## 2024-01-17 DIAGNOSIS — Z3403 Encounter for supervision of normal first pregnancy, third trimester: Secondary | ICD-10-CM | POA: Diagnosis not present

## 2024-01-31 DIAGNOSIS — Z369 Encounter for antenatal screening, unspecified: Secondary | ICD-10-CM | POA: Diagnosis not present

## 2024-01-31 DIAGNOSIS — Z3480 Encounter for supervision of other normal pregnancy, unspecified trimester: Secondary | ICD-10-CM | POA: Diagnosis not present

## 2024-01-31 LAB — OB RESULTS CONSOLE GBS: GBS: NEGATIVE

## 2024-02-08 DIAGNOSIS — Z369 Encounter for antenatal screening, unspecified: Secondary | ICD-10-CM | POA: Diagnosis not present

## 2024-02-17 ENCOUNTER — Encounter (HOSPITAL_COMMUNITY): Payer: Self-pay | Admitting: Obstetrics and Gynecology

## 2024-02-17 ENCOUNTER — Other Ambulatory Visit: Payer: Self-pay

## 2024-02-17 ENCOUNTER — Inpatient Hospital Stay (HOSPITAL_COMMUNITY)
Admission: AD | Admit: 2024-02-17 | Discharge: 2024-02-17 | Disposition: A | Discharge status: Home / Self Care | Attending: Family Medicine | Admitting: Family Medicine

## 2024-02-17 DIAGNOSIS — R030 Elevated blood-pressure reading, without diagnosis of hypertension: Secondary | ICD-10-CM

## 2024-02-17 DIAGNOSIS — Z369 Encounter for antenatal screening, unspecified: Secondary | ICD-10-CM | POA: Diagnosis not present

## 2024-02-17 DIAGNOSIS — O26893 Other specified pregnancy related conditions, third trimester: Secondary | ICD-10-CM | POA: Diagnosis not present

## 2024-02-17 DIAGNOSIS — Z3A39 39 weeks gestation of pregnancy: Secondary | ICD-10-CM

## 2024-02-17 DIAGNOSIS — Z3689 Encounter for other specified antenatal screening: Secondary | ICD-10-CM

## 2024-02-17 LAB — URINALYSIS, ROUTINE W REFLEX MICROSCOPIC
Bilirubin Urine: NEGATIVE
Glucose, UA: NEGATIVE mg/dL
Hgb urine dipstick: NEGATIVE
Ketones, ur: NEGATIVE mg/dL
Leukocytes,Ua: NEGATIVE
Nitrite: NEGATIVE
Protein, ur: NEGATIVE mg/dL
Specific Gravity, Urine: 1.01 (ref 1.005–1.030)
pH: 7 (ref 5.0–8.0)

## 2024-02-17 LAB — CBC WITH DIFFERENTIAL/PLATELET
Abs Immature Granulocytes: 0.09 K/uL — ABNORMAL HIGH (ref 0.00–0.07)
Basophils Absolute: 0.1 K/uL (ref 0.0–0.1)
Basophils Relative: 1 %
Eosinophils Absolute: 0 K/uL (ref 0.0–0.5)
Eosinophils Relative: 0 %
HCT: 33.8 % — ABNORMAL LOW (ref 36.0–46.0)
Hemoglobin: 10.7 g/dL — ABNORMAL LOW (ref 12.0–15.0)
Immature Granulocytes: 1 %
Lymphocytes Relative: 20 %
Lymphs Abs: 2 K/uL (ref 0.7–4.0)
MCH: 27.6 pg (ref 26.0–34.0)
MCHC: 31.7 g/dL (ref 30.0–36.0)
MCV: 87.3 fL (ref 80.0–100.0)
Monocytes Absolute: 0.9 K/uL (ref 0.1–1.0)
Monocytes Relative: 9 %
Neutro Abs: 7.1 K/uL (ref 1.7–7.7)
Neutrophils Relative %: 69 %
Platelets: 184 K/uL (ref 150–400)
RBC: 3.87 MIL/uL (ref 3.87–5.11)
RDW: 12.9 % (ref 11.5–15.5)
WBC: 10.2 K/uL (ref 4.0–10.5)
nRBC: 0 % (ref 0.0–0.2)

## 2024-02-17 LAB — COMPREHENSIVE METABOLIC PANEL WITH GFR
ALT: 17 U/L (ref 0–44)
AST: 29 U/L (ref 15–41)
Albumin: 3 g/dL — ABNORMAL LOW (ref 3.5–5.0)
Alkaline Phosphatase: 157 U/L — ABNORMAL HIGH (ref 38–126)
Anion gap: 10 (ref 5–15)
BUN: 6 mg/dL (ref 6–20)
CO2: 23 mmol/L (ref 22–32)
Calcium: 8.8 mg/dL — ABNORMAL LOW (ref 8.9–10.3)
Chloride: 102 mmol/L (ref 98–111)
Creatinine, Ser: 0.63 mg/dL (ref 0.44–1.00)
GFR, Estimated: >60 mL/min (ref >60)
Glucose, Bld: 89 mg/dL (ref 70–99)
Potassium: 3.7 mmol/L (ref 3.5–5.1)
Sodium: 135 mmol/L (ref 135–145)
Total Bilirubin: 0.5 mg/dL (ref 0.0–1.2)
Total Protein: 6.8 g/dL (ref 6.5–8.1)

## 2024-02-17 LAB — PROTEIN / CREATININE RATIO, URINE
Creatinine, Urine: 28 mg/dL
Total Protein, Urine: <6 mg/dL

## 2024-02-17 NOTE — MAU Note (Signed)
 MAU Triage Note  Chelsey Swanson is a 28 y.o. at [redacted]w[redacted]d here in MAU reporting: sent from the office; her BP in the office was 140s/80s. Denies HA, epigastric pain, and visual disturbances. Denies VB, reports when she went home after the office she thought she had a small amount of leaking, but reports she wasn't sure if this was from her cervical exam (3.5cm). Endorses +FM.   Onset of complaint: 1300 Pain score: back 7/10 Vitals:   02/17/24 1516  BP: (!) 133/91  Pulse: (!) 115  Resp: 17  Temp: 98.5 F (36.9 C)  SpO2: 100%     FHT: 138  Lab orders placed from triage: placed by provider

## 2024-02-17 NOTE — MAU Note (Signed)
 RN contacted lab about protein creatinine ratio. Lab reports PCR will be in process shortly.

## 2024-02-17 NOTE — MAU Provider Note (Signed)
 Chief Complaint:  Back Pain and Hypertension   HPI   None     Chelsey Swanson is a 28 y.o. G1P0000 at [redacted]w[redacted]d who presents to maternity admissions reporting she was sent from office today due to first elevated blood pressure in office. She denies HA, visual disturbances, edema, or epigastric pain. She endorses regular and vigorous fetal movement. She denies LOF or VB. She does endorse contractions.   Pregnancy Course: [facility]  Past Medical History:  Diagnosis Date   Abdominal pain, chronic, generalized 01/29/2014   Allergies    Anemia, iron deficiency 10/03/2014   Chest pain 05/07/2014   Chlamydia 02/24/2021   02/24/21 treated with doxycycline , POC_____   Dizziness    normal cardiac workup   Dysplastic nevus 02/12/2021   moderate, right lower abdomen   Dysplastic nevus 02/19/2021   moderate, right inframammary   H/O echocardiogram 04/15/2014   normal   IBS (irritable bowel syndrome)    Orthostatic hypotension 10/03/2014   Pre-syncope 10/15/2014   Skull fracture (HCC)    28 yo   OB History  Gravida Para Term Preterm AB Living  1 0 0 0 0 0  SAB IAB Ectopic Multiple Live Births  0 0 0 0 0    # Outcome Date GA Lbr Len/2nd Weight Sex Type Anes PTL Lv  1 Current            Past Surgical History:  Procedure Laterality Date   ADENOIDECTOMY     TONSILLECTOMY     tubes in ears     WISDOM TOOTH EXTRACTION     Family History  Problem Relation Age of Onset   Healthy Mother    Heart disease Father    Hypertension Brother    Lung cancer Paternal Grandmother    Social History   Tobacco Use   Smoking status: Never    Passive exposure: Yes   Smokeless tobacco: Never  Vaping Use   Vaping status: Never Used  Substance Use Topics   Alcohol use: Not Currently   Drug use: No   Allergies  Allergen Reactions   Other Other (See Comments) and Cough    Pollen-runny nose,sneezing,itchy eyes    No medications prior to admission.    I have reviewed patient's Past  Medical Hx, Surgical Hx, Family Hx, Social Hx, medications and allergies.   ROS  Pertinent items noted in HPI and remainder of comprehensive ROS otherwise negative.   PHYSICAL EXAM  Patient Vitals for the past 24 hrs:  BP Temp Temp src Pulse Resp SpO2 Height Weight  02/17/24 1705 129/84 -- -- (!) 105 -- -- -- --  02/17/24 1700 132/86 -- -- (!) 101 -- 99 % -- --  02/17/24 1630 122/71 -- -- (!) 106 -- 100 % -- --  02/17/24 1615 126/75 -- -- (!) 104 -- 100 % -- --  02/17/24 1600 122/67 -- -- (!) 121 -- 100 % -- --  02/17/24 1545 (!) 141/85 -- -- (!) 104 -- 99 % -- --  02/17/24 1530 133/85 -- -- (!) 105 -- 100 % -- --  02/17/24 1516 (!) 133/91 98.5 F (36.9 C) Oral (!) 115 17 100 % -- --  02/17/24 1506 -- -- -- -- -- -- 5' 6 (1.676 m) 78.2 kg    Physical Exam Vitals and nursing note reviewed.  Constitutional:      General: She is not in acute distress.    Appearance: Normal appearance. She is not ill-appearing, toxic-appearing or  diaphoretic.  HENT:     Head: Normocephalic.  Cardiovascular:     Rate and Rhythm: Normal rate.     Pulses: Normal pulses.  Pulmonary:     Effort: Pulmonary effort is normal.  Abdominal:     Comments: Gravid  Skin:    General: Skin is warm and dry.     Capillary Refill: Capillary refill takes less than 2 seconds.  Neurological:     General: No focal deficit present.     Mental Status: She is alert and oriented to person, place, and time.  Psychiatric:        Mood and Affect: Mood normal.        Behavior: Behavior normal.        Thought Content: Thought content normal.        Judgment: Judgment normal.         Fetal Tracing: Baseline: 135 Variability: moderate Accelerations: 15x15 Decelerations:absent Toco: 2-5   Labs: Results for orders placed or performed during the hospital encounter of 02/17/24 (from the past 24 hours)  Comprehensive metabolic panel     Status: Abnormal   Collection Time: 02/17/24  3:02 PM  Result Value Ref Range    Sodium 135 135 - 145 mmol/L   Potassium 3.7 3.5 - 5.1 mmol/L   Chloride 102 98 - 111 mmol/L   CO2 23 22 - 32 mmol/L   Glucose, Bld 89 70 - 99 mg/dL   BUN 6 6 - 20 mg/dL   Creatinine, Ser 9.36 0.44 - 1.00 mg/dL   Calcium 8.8 (L) 8.9 - 10.3 mg/dL   Total Protein 6.8 6.5 - 8.1 g/dL   Albumin 3.0 (L) 3.5 - 5.0 g/dL   AST 29 15 - 41 U/L   ALT 17 0 - 44 U/L   Alkaline Phosphatase 157 (H) 38 - 126 U/L   Total Bilirubin 0.5 0.0 - 1.2 mg/dL   GFR, Estimated >39 >39 mL/min   Anion gap 10 5 - 15  CBC with Differential/Platelet     Status: Abnormal   Collection Time: 02/17/24  3:02 PM  Result Value Ref Range   WBC 10.2 4.0 - 10.5 K/uL   RBC 3.87 3.87 - 5.11 MIL/uL   Hemoglobin 10.7 (L) 12.0 - 15.0 g/dL   HCT 66.1 (L) 63.9 - 53.9 %   MCV 87.3 80.0 - 100.0 fL   MCH 27.6 26.0 - 34.0 pg   MCHC 31.7 30.0 - 36.0 g/dL   RDW 87.0 88.4 - 84.4 %   Platelets 184 150 - 400 K/uL   nRBC 0.0 0.0 - 0.2 %   Neutrophils Relative % 69 %   Neutro Abs 7.1 1.7 - 7.7 K/uL   Lymphocytes Relative 20 %   Lymphs Abs 2.0 0.7 - 4.0 K/uL   Monocytes Relative 9 %   Monocytes Absolute 0.9 0.1 - 1.0 K/uL   Eosinophils Relative 0 %   Eosinophils Absolute 0.0 0.0 - 0.5 K/uL   Basophils Relative 1 %   Basophils Absolute 0.1 0.0 - 0.1 K/uL   Immature Granulocytes 1 %   Abs Immature Granulocytes 0.09 (H) 0.00 - 0.07 K/uL  Protein / creatinine ratio, urine     Status: None   Collection Time: 02/17/24  3:19 PM  Result Value Ref Range   Creatinine, Urine 28 mg/dL   Total Protein, Urine <6 mg/dL   Protein Creatinine Ratio        0.00 - 0.15 mg/mg[Cre]  Urinalysis, Routine w reflex microscopic -Urine,  Clean Catch     Status: None   Collection Time: 02/17/24  3:19 PM  Result Value Ref Range   Color, Urine YELLOW YELLOW   APPearance CLEAR CLEAR   Specific Gravity, Urine 1.010 1.005 - 1.030   pH 7.0 5.0 - 8.0   Glucose, UA NEGATIVE NEGATIVE mg/dL   Hgb urine dipstick NEGATIVE NEGATIVE   Bilirubin Urine NEGATIVE  NEGATIVE   Ketones, ur NEGATIVE NEGATIVE mg/dL   Protein, ur NEGATIVE NEGATIVE mg/dL   Nitrite NEGATIVE NEGATIVE   Leukocytes,Ua NEGATIVE NEGATIVE    Imaging:  No results found.  MDM & MAU COURSE  MDM:  Moderate  MAU Course:  Initial elevated BP at [facility] appointment today. PEC workup benign. Does not yet meet criteria for GHTN.  Advise strict return precautions, discussed plan with Dr. Delana: discharge today, BP check Monday and IOL scheduled for next Wednesday.    Orders Placed This Encounter  Procedures   Comprehensive metabolic panel   CBC with Differential/Platelet   Protein / creatinine ratio, urine   Urinalysis, Routine w reflex microscopic -Urine, Clean Catch   Discharge patient   No orders of the defined types were placed in this encounter.   ASSESSMENT   1. Elevated BP without diagnosis of hypertension   2. [redacted] weeks gestation of pregnancy   3. NST (non-stress test) reactive     PLAN  Discharge home in stable condition. Strict return precautions advised. PEC workup negative.  Follow up as below for BP and NST/BPP:   Follow-up Information     Ob/Gyn, [facility]. Call on 02/20/2024.   Why: Please call for an appointment Monday 02/20/24. Contact information: [address] [place] [address] [phone]                 Allergies as of 02/17/2024       Reactions   Other Other (See Comments), Cough   Pollen-runny nose,sneezing,itchy eyes        Medication List     TAKE these medications    Flonase  Allergy Relief 50 MCG/ACT nasal spray Generic drug: fluticasone  As needed for allergies As needed for allergies   loratadine 10 MG tablet Commonly known as: CLARITIN Take 10 mg by mouth.   omeprazole  20 MG capsule Commonly known as: PRILOSEC Take 1 capsule (20 mg total) by mouth daily. 1 tablet a day What changed: Another medication with the same name was removed. Continue taking this medication, and follow the directions  you see here.   PRENATAL VITAMIN PO Take by mouth. Gummy-takes 2 daily   XYZAL PO Take by mouth.        Camie Rote, MSN, CNM 02/17/2024 7:10 PM  Certified Nurse Midwife, [facility]

## 2024-02-17 NOTE — Discharge Instructions (Signed)
 Early Labor Tips   Rest when able, especially if your labor starts at night. Side lying positions can feel better than lying on your back  Move around if you aren't able to rest or if your labor starts at night.  Gentle movement, upright or forward-leaning positions can help your baby to be in a good position and put more pressure on your cervix so it can open. Hip circles standing or on a birth ball can help.  Drink water and eat easily digestible snacks. You may also consider an electrolyte drink (like Gatorade) if you are nauseated or do not have an appetite. Distract yourself with a movie, podcasts, walking outside, music, etc.   The [place] is a series of exercises you can do in early labor to encourage your baby into a better position to make progress in labor.  It is available with pictures at themilescircuit.com  Pain management at home  Try any relaxation and breathing techniques you have learned in antenatal classes. Have a massage. Your birth partner could help by rubbing your back. Take acetaminophen  (Tylenol ) according to the instructions on the packet - it's safe to take in labour. Have a warm bath or shower. 5.  You may use a TENS unit if you have access to one.   Thank you for trusting us  to care for you, Camie, Midwife

## 2024-02-20 ENCOUNTER — Telehealth (HOSPITAL_COMMUNITY): Payer: Self-pay | Admitting: *Deleted

## 2024-02-20 ENCOUNTER — Encounter (HOSPITAL_COMMUNITY): Payer: Self-pay | Admitting: *Deleted

## 2024-02-20 DIAGNOSIS — Z369 Encounter for antenatal screening, unspecified: Secondary | ICD-10-CM | POA: Diagnosis not present

## 2024-02-20 NOTE — Telephone Encounter (Signed)
 Preadmission screen

## 2024-02-21 ENCOUNTER — Other Ambulatory Visit: Payer: Self-pay | Admitting: Obstetrics and Gynecology

## 2024-02-21 DIAGNOSIS — O133 Gestational [pregnancy-induced] hypertension without significant proteinuria, third trimester: Secondary | ICD-10-CM

## 2024-02-22 ENCOUNTER — Encounter (HOSPITAL_COMMUNITY): Payer: Self-pay | Admitting: Obstetrics and Gynecology

## 2024-02-22 ENCOUNTER — Other Ambulatory Visit: Payer: Self-pay

## 2024-02-22 ENCOUNTER — Inpatient Hospital Stay (HOSPITAL_COMMUNITY): Admitting: Anesthesiology

## 2024-02-22 ENCOUNTER — Inpatient Hospital Stay (HOSPITAL_COMMUNITY)

## 2024-02-22 ENCOUNTER — Inpatient Hospital Stay (HOSPITAL_COMMUNITY)
Admission: RE | Admit: 2024-02-22 | Discharge: 2024-02-24 | DRG: 807 | Disposition: A | Discharge status: Home / Self Care | Attending: Obstetrics and Gynecology | Admitting: Obstetrics and Gynecology

## 2024-02-22 DIAGNOSIS — O164 Unspecified maternal hypertension, complicating childbirth: Secondary | ICD-10-CM | POA: Diagnosis not present

## 2024-02-22 DIAGNOSIS — Z8249 Family history of ischemic heart disease and other diseases of the circulatory system: Secondary | ICD-10-CM | POA: Diagnosis not present

## 2024-02-22 DIAGNOSIS — M545 Low back pain, unspecified: Secondary | ICD-10-CM | POA: Diagnosis not present

## 2024-02-22 DIAGNOSIS — Z3A40 40 weeks gestation of pregnancy: Secondary | ICD-10-CM | POA: Diagnosis not present

## 2024-02-22 DIAGNOSIS — O99893 Other specified diseases and conditions complicating puerperium: Secondary | ICD-10-CM | POA: Diagnosis not present

## 2024-02-22 DIAGNOSIS — O326XX0 Maternal care for compound presentation, not applicable or unspecified: Secondary | ICD-10-CM | POA: Diagnosis present

## 2024-02-22 DIAGNOSIS — O139 Gestational [pregnancy-induced] hypertension without significant proteinuria, unspecified trimester: Secondary | ICD-10-CM

## 2024-02-22 DIAGNOSIS — O134 Gestational [pregnancy-induced] hypertension without significant proteinuria, complicating childbirth: Principal | ICD-10-CM | POA: Diagnosis present

## 2024-02-22 DIAGNOSIS — O133 Gestational [pregnancy-induced] hypertension without significant proteinuria, third trimester: Secondary | ICD-10-CM

## 2024-02-22 LAB — CBC
HCT: 32.8 % — ABNORMAL LOW (ref 36.0–46.0)
HCT: 30.4 % — ABNORMAL LOW (ref 36.0–46.0)
Hemoglobin: 10.7 g/dL — ABNORMAL LOW (ref 12.0–15.0)
Hemoglobin: 10 g/dL — ABNORMAL LOW (ref 12.0–15.0)
MCH: 28.4 pg (ref 26.0–34.0)
MCH: 28.4 pg (ref 26.0–34.0)
MCHC: 32.6 g/dL (ref 30.0–36.0)
MCHC: 32.9 g/dL (ref 30.0–36.0)
MCV: 87 fL (ref 80.0–100.0)
MCV: 86.4 fL (ref 80.0–100.0)
Platelets: 180 K/uL (ref 150–400)
Platelets: 170 K/uL (ref 150–400)
RBC: 3.77 MIL/uL — ABNORMAL LOW (ref 3.87–5.11)
RBC: 3.52 MIL/uL — ABNORMAL LOW (ref 3.87–5.11)
RDW: 13.3 % (ref 11.5–15.5)
RDW: 13.3 % (ref 11.5–15.5)
WBC: 10.3 K/uL (ref 4.0–10.5)
WBC: 15.6 K/uL — ABNORMAL HIGH (ref 4.0–10.5)
nRBC: 0 % (ref 0.0–0.2)
nRBC: 0 % (ref 0.0–0.2)

## 2024-02-22 LAB — SYPHILIS: RPR W/REFLEX TO RPR TITER AND TREPONEMAL ANTIBODIES, TRADITIONAL SCREENING AND DIAGNOSIS ALGORITHM: RPR Ser Ql: NONREACTIVE

## 2024-02-22 LAB — TYPE AND SCREEN
ABO/RH(D): O POS
Antibody Screen: NEGATIVE

## 2024-02-22 MED ORDER — OXYTOCIN-SODIUM CHLORIDE 30-0.9 UT/500ML-% IV SOLN: 2.5000 [IU]/h | INTRAVENOUS | Status: DC | PRN

## 2024-02-22 MED ORDER — PHENYLEPHRINE 80 MCG/ML (10ML) SYRINGE FOR IV PUSH (FOR BLOOD PRESSURE SUPPORT): 80.0000 ug | PREFILLED_SYRINGE | INTRAVENOUS | Status: DC | PRN

## 2024-02-22 MED ORDER — LIDOCAINE HCL (PF) 1 % IJ SOLN
INTRAMUSCULAR | Status: DC | PRN
  Administered 2024-02-22: 8 mL via EPIDURAL

## 2024-02-22 MED ORDER — ONDANSETRON HCL 4 MG/2ML IJ SOLN: 4.0000 mg | INTRAMUSCULAR | Status: DC | PRN

## 2024-02-22 MED ORDER — LACTATED RINGERS IV SOLN: 500.0000 mL | Freq: Once | INTRAVENOUS | Status: DC

## 2024-02-22 MED ORDER — ACETAMINOPHEN 325 MG PO TABS
650.0000 mg | ORAL_TABLET | ORAL | Status: DC | PRN
  Administered 2024-02-22: 650 mg via ORAL
  Filled 2024-02-22: qty 2

## 2024-02-22 MED ORDER — TETANUS-DIPHTH-ACELL PERTUSSIS 5-2-15.5 LF-MCG/0.5 IM SUSP
0.5000 mL | Freq: Once | INTRAMUSCULAR | Status: DC
  Filled 2024-02-22: qty 0.5

## 2024-02-22 MED ORDER — COCONUT OIL OIL: 1.0000 | TOPICAL_OIL | Status: DC | PRN

## 2024-02-22 MED ORDER — FENTANYL-BUPIVACAINE-NACL 0.5-0.125-0.9 MG/250ML-% EP SOLN: 12.0000 mL/h | EPIDURAL | Status: DC | PRN

## 2024-02-22 MED ORDER — FENTANYL-BUPIVACAINE-NACL 0.5-0.125-0.9 MG/250ML-% EP SOLN
12.0000 mL/h | EPIDURAL | Status: DC | PRN
  Administered 2024-02-22: 12 mL/h via EPIDURAL
  Filled 2024-02-22: qty 250

## 2024-02-22 MED ORDER — EPHEDRINE 5 MG/ML INJ: 10.0000 mg | INTRAVENOUS | Status: DC | PRN

## 2024-02-22 MED ORDER — LACTATED RINGERS IV SOLN: INTRAVENOUS | Status: DC

## 2024-02-22 MED ORDER — OXYTOCIN BOLUS FROM INFUSION
333.0000 mL | Freq: Once | INTRAVENOUS | Status: AC
  Administered 2024-02-22: 333 mL via INTRAVENOUS

## 2024-02-22 MED ORDER — LACTATED RINGERS IV SOLN: 500.0000 mL | INTRAVENOUS | Status: DC | PRN

## 2024-02-22 MED ORDER — DIBUCAINE (PERIANAL) 1 % EX OINT: 1.0000 | TOPICAL_OINTMENT | CUTANEOUS | Status: DC | PRN

## 2024-02-22 MED ORDER — TERBUTALINE SULFATE 1 MG/ML IJ SOLN: 0.2500 mg | Freq: Once | INTRAMUSCULAR | Status: DC | PRN

## 2024-02-22 MED ORDER — SIMETHICONE 80 MG PO CHEW: 80.0000 mg | CHEWABLE_TABLET | ORAL | Status: DC | PRN

## 2024-02-22 MED ORDER — OXYCODONE HCL 5 MG PO TABS
5.0000 mg | ORAL_TABLET | ORAL | Status: DC | PRN
  Administered 2024-02-22 – 2024-02-24 (×5): 5 mg via ORAL
  Filled 2024-02-22 (×5): qty 1

## 2024-02-22 MED ORDER — OXYCODONE HCL 5 MG PO TABS: 10.0000 mg | ORAL_TABLET | ORAL | Status: DC | PRN

## 2024-02-22 MED ORDER — LIDOCAINE HCL (PF) 1 % IJ SOLN
30.0000 mL | INTRAMUSCULAR | Status: AC | PRN
  Administered 2024-02-22: 30 mL via SUBCUTANEOUS
  Filled 2024-02-22: qty 30

## 2024-02-22 MED ORDER — DIPHENHYDRAMINE HCL 50 MG/ML IJ SOLN: 12.5000 mg | INTRAMUSCULAR | Status: DC | PRN

## 2024-02-22 MED ORDER — SENNOSIDES-DOCUSATE SODIUM 8.6-50 MG PO TABS
2.0000 | ORAL_TABLET | Freq: Every day | ORAL | Status: DC
  Administered 2024-02-23 – 2024-02-24 (×2): 2 via ORAL
  Filled 2024-02-22 (×2): qty 2

## 2024-02-22 MED ORDER — FENTANYL CITRATE (PF) 100 MCG/2ML IJ SOLN
50.0000 ug | INTRAMUSCULAR | Status: DC | PRN
  Administered 2024-02-22: 100 ug via INTRAVENOUS
  Filled 2024-02-22: qty 2

## 2024-02-22 MED ORDER — METHYLERGONOVINE MALEATE 0.2 MG PO TABS: 0.2000 mg | ORAL_TABLET | ORAL | Status: DC | PRN

## 2024-02-22 MED ORDER — BENZOCAINE-MENTHOL 20-0.5 % EX AERO
1.0000 | INHALATION_SPRAY | CUTANEOUS | Status: DC | PRN
  Filled 2024-02-22 (×2): qty 56

## 2024-02-22 MED ORDER — OXYCODONE-ACETAMINOPHEN 5-325 MG PO TABS: 2.0000 | ORAL_TABLET | ORAL | Status: DC | PRN

## 2024-02-22 MED ORDER — DIPHENHYDRAMINE HCL 25 MG PO CAPS: 25.0000 mg | ORAL_CAPSULE | Freq: Four times a day (QID) | ORAL | Status: DC | PRN

## 2024-02-22 MED ORDER — PRENATAL MULTIVITAMIN CH
1.0000 | ORAL_TABLET | Freq: Every day | ORAL | Status: DC
  Administered 2024-02-23 – 2024-02-24 (×2): 1 via ORAL
  Filled 2024-02-22 (×2): qty 1

## 2024-02-22 MED ORDER — IBUPROFEN 600 MG PO TABS
600.0000 mg | ORAL_TABLET | Freq: Four times a day (QID) | ORAL | Status: DC
  Administered 2024-02-22 – 2024-02-24 (×8): 600 mg via ORAL
  Filled 2024-02-22 (×8): qty 1

## 2024-02-22 MED ORDER — OXYTOCIN-SODIUM CHLORIDE 30-0.9 UT/500ML-% IV SOLN: 2.5000 [IU]/h | INTRAVENOUS | Status: DC

## 2024-02-22 MED ORDER — ONDANSETRON HCL 4 MG PO TABS: 4.0000 mg | ORAL_TABLET | ORAL | Status: DC | PRN

## 2024-02-22 MED ORDER — ACETAMINOPHEN 325 MG PO TABS
650.0000 mg | ORAL_TABLET | ORAL | Status: DC | PRN
  Administered 2024-02-22 – 2024-02-24 (×5): 650 mg via ORAL
  Filled 2024-02-22 (×5): qty 2

## 2024-02-22 MED ORDER — SOD CITRATE-CITRIC ACID 500-334 MG/5ML PO SOLN: 30.0000 mL | ORAL | Status: DC | PRN

## 2024-02-22 MED ORDER — ONDANSETRON HCL 4 MG/2ML IJ SOLN: 4.0000 mg | Freq: Four times a day (QID) | INTRAMUSCULAR | Status: DC | PRN

## 2024-02-22 MED ORDER — OXYCODONE-ACETAMINOPHEN 5-325 MG PO TABS: 1.0000 | ORAL_TABLET | ORAL | Status: DC | PRN

## 2024-02-22 MED ORDER — METHYLERGONOVINE MALEATE 0.2 MG/ML IJ SOLN: 0.2000 mg | INTRAMUSCULAR | Status: DC | PRN

## 2024-02-22 MED ORDER — OXYTOCIN-SODIUM CHLORIDE 30-0.9 UT/500ML-% IV SOLN
1.0000 m[IU]/min | INTRAVENOUS | Status: DC
  Administered 2024-02-22: 2 m[IU]/min via INTRAVENOUS
  Filled 2024-02-22: qty 500

## 2024-02-22 MED ORDER — PHENYLEPHRINE 80 MCG/ML (10ML) SYRINGE FOR IV PUSH (FOR BLOOD PRESSURE SUPPORT)
80.0000 ug | PREFILLED_SYRINGE | INTRAVENOUS | Status: DC | PRN
  Filled 2024-02-22: qty 10

## 2024-02-22 MED ORDER — ZOLPIDEM TARTRATE 5 MG PO TABS: 5.0000 mg | ORAL_TABLET | Freq: Every evening | ORAL | Status: DC | PRN

## 2024-02-22 MED ORDER — WITCH HAZEL-GLYCERIN EX PADS: 1.0000 | MEDICATED_PAD | CUTANEOUS | Status: DC | PRN

## 2024-02-22 NOTE — Anesthesia Procedure Notes (Signed)
 Epidural Patient location during procedure: OB Start time: 02/22/2024 11:39 AM End time: 02/22/2024 11:43 AM  Staffing Anesthesiologist: Mallory Manus, MD Performed: other anesthesia staff   Preanesthetic Checklist Completed: patient identified, IV checked, site marked, risks and benefits discussed, surgical consent, monitors and equipment checked, pre-op evaluation and timeout performed  Epidural Patient position: sitting Prep: DuraPrep and site prepped and draped Patient monitoring: continuous pulse ox and blood pressure Approach: midline Location: L4-L5 Injection technique: LOR air  Needle:  Needle type: Tuohy  Needle gauge: 17 G Needle length: 9 cm and 9 Needle insertion depth: 6 cm Catheter type: closed end flexible Catheter size: 19 Gauge Catheter at skin depth: 12 cm Test dose: negative  Assessment Events: blood not aspirated, no cerebrospinal fluid, injection not painful, no injection resistance, no paresthesia and negative IV test

## 2024-02-22 NOTE — Anesthesia Preprocedure Evaluation (Signed)
 Anesthesia Evaluation  Patient identified by MRN, date of birth, ID band Patient awake    Reviewed: Allergy & Precautions, H&P , NPO status , Patient's Chart, lab work & pertinent test results  Airway Mallampati: II  TM Distance: >3 FB Neck ROM: Full    Dental no notable dental hx. (+) Teeth Intact, Dental Advisory Given   Pulmonary neg pulmonary ROS   Pulmonary exam normal breath sounds clear to auscultation       Cardiovascular Exercise Tolerance: Good hypertension, negative cardio ROS Normal cardiovascular exam Rhythm:Regular Rate:Normal     Neuro/Psych negative neurological ROS  negative psych ROS   GI/Hepatic negative GI ROS, Neg liver ROS,,,  Endo/Other  negative endocrine ROS    Renal/GU negative Renal ROS  negative genitourinary   Musculoskeletal negative musculoskeletal ROS (+)    Abdominal   Peds negative pediatric ROS (+)  Hematology negative hematology ROS (+)   Anesthesia Other Findings   Reproductive/Obstetrics negative OB ROS                              Anesthesia Physical Anesthesia Plan  ASA: 2  Anesthesia Plan: General   Post-op Pain Management: Minimal or no pain anticipated   Induction: Intravenous  PONV Risk Score and Plan: 3 and Treatment may vary due to age or medical condition  Airway Management Planned: Natural Airway  Additional Equipment: Fetal Monitoring  Intra-op Plan:   Post-operative Plan:   Informed Consent: I have reviewed the patients History and Physical, chart, labs and discussed the procedure including the risks, benefits and alternatives for the proposed anesthesia with the patient or authorized representative who has indicated his/her understanding and acceptance.     Dental Advisory Given  Plan Discussed with: Anesthesiologist and CRNA  Anesthesia Plan Comments: (Labs checked- platelets confirmed with RN in room. Fetal heart  tracing, per RN, reported to be stable enough for sitting procedure. Discussed epidural, and patient consents to the procedure:  included risk of possible headache,backache, failed block, allergic reaction, and nerve injury. This patient was asked if she had any questions or concerns before the procedure started.)         Anesthesia Quick Evaluation

## 2024-02-22 NOTE — Anesthesia Postprocedure Evaluation (Signed)
 Anesthesia Post Note  Patient: Chelsey Swanson  Procedure(s) Performed: AN AD HOC LABOR EPIDURAL     Patient location during evaluation: Mother Baby Anesthesia Type: Epidural Level of consciousness: awake and alert Pain management: pain level controlled Vital Signs Assessment: post-procedure vital signs reviewed and stable Respiratory status: spontaneous breathing, nonlabored ventilation and respiratory function stable Cardiovascular status: stable Postop Assessment: no headache, no backache and epidural receding Anesthetic complications: no   No notable events documented.  Last Vitals:  Vitals:   02/22/24 1755 02/22/24 1823  BP: 128/89 138/83  Pulse: 91 89  Resp: 16 16  Temp: 37 C   SpO2:      Last Pain:  Vitals:   02/22/24 2104  TempSrc:   PainSc: 5                  Gatlyn Lipari

## 2024-02-22 NOTE — H&P (Addendum)
 Chelsey Swanson is a 28 y.o. female presenting for term induction of labor  28 year old G1, P0 presents at 40+0 for induction of labor with a favorable cervix.  Patient had a NT scan performed for integrative screening early in pregnancy.  During that scan there was concern for bilateral avascular neck cysts.  On a follow-up anatomy scan there was concern for a thickened nuchal fold.  The patient was referred to maternal-fetal medicine for additional imaging.  Ultrasound with maternal-fetal medicine at 25 weeks did not show any evidence of residual neck cysts or nuchal thickening   Her pregnancy has been uncomplicated.  Transferred her obstetric care at 28 weeks to [facility].  In her second to last prenatal visit she had 1 elevated blood pressure on repeat this was normal. OB History     Gravida  1   Para  0   Term  0   Preterm  0   AB  0   Living  0      SAB  0   IAB  0   Ectopic  0   Multiple  0   Live Births  0          Past Medical History:  Diagnosis Date   Abdominal pain, chronic, generalized 01/29/2014   Allergies    Anemia, iron deficiency 10/03/2014   Chest pain 05/07/2014   Chlamydia 02/24/2021   02/24/21 treated with doxycycline , POC_____   Dizziness    normal cardiac workup   Dysplastic nevus 02/12/2021   moderate, right lower abdomen   Dysplastic nevus 02/19/2021   moderate, right inframammary   H/O echocardiogram 04/15/2014   normal   IBS (irritable bowel syndrome)    Orthostatic hypotension 10/03/2014   Pre-syncope 10/15/2014   Pregnancy induced hypertension    Skull fracture (HCC)    28 yo   Past Surgical History:  Procedure Laterality Date   ADENOIDECTOMY     TONSILLECTOMY     tubes in ears     WISDOM TOOTH EXTRACTION     Family History: family history includes Healthy in her mother; Heart disease in her father; Hypertension in her brother; Lung cancer in her paternal grandmother. Social History:  reports that she has never smoked.  She has been exposed to tobacco smoke. She has never used smokeless tobacco. She reports that she does not currently use alcohol. She reports that she does not use drugs.     Maternal Diabetes: No Genetic Screening: Normal Maternal Ultrasounds/Referrals: Normal Fetal Ultrasounds or other Referrals:  MFM prior to transfer Maternal Substance Abuse:  No Significant Maternal Medications:  None Significant Maternal Lab Results:  Group B Strep negative Number of Prenatal Visits:greater than 3 verified prenatal visits Maternal Vaccinations:RSV: Given during pregnancy >/=14 days ago, TDap, and Flu Other Comments:  None  Review of Systems History Dilation: 10 Effacement (%): 100 Station: 0, Plus 1 Exam by:: Nicholaus, RN Blood pressure 131/74, pulse 95, temperature 98 F (36.7 C), temperature source Axillary, resp. rate 20, height 5' 6 (1.676 m), weight 78.1 kg, last menstrual period 05/18/2023, SpO2 99%. Exam Physical Exam  Alert and orient x 3, no apparent distress Gravid, soft Fetal heart rate 130, reactive, category 1 tracing Cervix 4/80/-2 Prenatal labs: ABO, Rh: --/--/O POS (12/10 9347) Antibody: NEG (12/10 9347) Rubella: 2.07 (06/11 1024) RPR: NON REACTIVE (12/10 0700)  HBsAg: Negative (06/11 1024)  HIV: Non Reactive (06/11 1024)  GBS: Negative/-- (11/18 0000)  Results for orders placed or performed during the  hospital encounter of 02/22/24 (from the past 24 hours)  Type and screen     Status: None   Collection Time: 02/22/24  6:52 AM  Result Value Ref Range   ABO/RH(D) O POS    Antibody Screen NEG    Sample Expiration      02/25/2024,2359 Performed at [facility], [address] [place], [place] [address]   CBC     Status: Abnormal   Collection Time: 02/22/24  7:00 AM  Result Value Ref Range   WBC 10.3 4.0 - 10.5 K/uL   RBC 3.77 (L) 3.87 - 5.11 MIL/uL   Hemoglobin 10.7 (L) 12.0 - 15.0 g/dL   HCT 67.1 (L) 63.9 - 53.9 %   MCV 87.0 80.0 - 100.0 fL   MCH 28.4  26.0 - 34.0 pg   MCHC 32.6 30.0 - 36.0 g/dL   RDW 86.6 88.4 - 84.4 %   Platelets 180 150 - 400 K/uL   nRBC 0.0 0.0 - 0.2 %  RPR     Status: None   Collection Time: 02/22/24  7:00 AM  Result Value Ref Range   RPR Ser Ql NON REACTIVE NON REACTIVE     Assessment/Plan: 1) admit 2) Pitocin  augmentation with AROM when able 3) epidural on request   Marjorie Gull 02/22/2024, 3:24 PM

## 2024-02-23 LAB — CBC
HCT: 30.3 % — ABNORMAL LOW (ref 36.0–46.0)
Hemoglobin: 9.7 g/dL — ABNORMAL LOW (ref 12.0–15.0)
MCH: 28.2 pg (ref 26.0–34.0)
MCHC: 32 g/dL (ref 30.0–36.0)
MCV: 88.1 fL (ref 80.0–100.0)
Platelets: 165 K/uL (ref 150–400)
RBC: 3.44 MIL/uL — ABNORMAL LOW (ref 3.87–5.11)
RDW: 13.6 % (ref 11.5–15.5)
WBC: 12.4 K/uL — ABNORMAL HIGH (ref 4.0–10.5)
nRBC: 0 % (ref 0.0–0.2)

## 2024-02-23 MED ORDER — OXYCODONE HCL 5 MG PO TABS: 5.0000 mg | ORAL_TABLET | ORAL | Status: DC | PRN

## 2024-02-23 NOTE — Lactation Note (Signed)
 This note was copied from a baby's chart. Lactation Consultation Note  Patient Name: Boy Dorsey Charette Unijb'd Date: 02/23/2024 Age:27 hours Reason for consult: Initial assessment;Primapara;Term  P1. Mom stated the baby has been latching well. No painful latch.  Newborn feeding habits, behavior, STS, I&O, reviewed. Mom encouraged to feed baby 8-12 times/24 hours and with feeding cues.  Encouraged to call for latch assist and to see latch. Mentioned different positions for BF. At this time mom doesn't have any questions. She is tired. Can't keep her eyes off of baby. Encouraged mom to call LC to see latch and for any questions or BF assistance. Maternal Data Does the patient have breastfeeding experience prior to this delivery?: No  Feeding    LATCH Score                    Lactation Tools Discussed/Used    Interventions Interventions: Breast feeding basics reviewed;Education;LC Services brochure  Discharge Discharge Education: Outpatient recommendation Pump: Hands Free (mom cozie)  Consult Status Consult Status: Follow-up Date: 02/23/24 Follow-up type: In-patient    Valeriano Bain G 02/23/2024, 12:29 AM

## 2024-02-23 NOTE — Lactation Note (Signed)
 This note was copied from a baby's chart. Lactation Consultation Note  Patient Name: Chelsey Swanson Unijb'd Date: 02/23/2024 Age:27 hours Reason for consult: Follow-up assessment;1st time breastfeeding;Term  P1, Baby cueing.  Mother's nipples tender. Provided coconut oil. Assisted with latching. Baby tries to latch only on the nipple and needed guidance to achieve a deeper latch. Flanged lips and mother stated that it was more tolerable in cross cradle hold.  Intermittent swallows observed. Discussed cluster feeding and suggest calling if she need assistance tonight.     Maternal Data Has patient been taught Hand Expression?: Yes Does the patient have breastfeeding experience prior to this delivery?: No  Feeding Mother's Current Feeding Choice: Breast Milk  LATCH Score Latch: Grasps breast easily, tongue down, lips flanged, rhythmical sucking.  Audible Swallowing: A few with stimulation  Type of Nipple: Everted at rest and after stimulation  Comfort (Breast/Nipple): Filling, red/small blisters or bruises, mild/mod discomfort  Hold (Positioning): Assistance needed to correctly position infant at breast and maintain latch.  LATCH Score: 7   Lactation Tools Discussed/Used Tools: Coconut oil  Interventions Interventions: Breast feeding basics reviewed;Assisted with latch;Skin to skin;Hand express;Position options;Support pillows;Coconut oil;Education  Discharge Pump: Hands Free  Consult Status Consult Status: Follow-up Date: 02/23/24 Follow-up type: In-patient    Shannon Dines [facility] 02/23/2024, 12:02 PM

## 2024-02-23 NOTE — Progress Notes (Signed)
 Post Partum Day 1 Subjective: up ad lib, voiding, tolerating PO, + flatus, and lochia mild. She c/o 6/10 back pain. Heating pads not helping and neither is tylenol /ibuprofen . Reports insomnia last night due to discomfort. Bonding well with baby though. She would like to stay till tomorrow to work on pain relief  Objective: Blood pressure 127/79, pulse 78, temperature 98.1 F (36.7 C), temperature source Oral, resp. rate 15, height 5' 6 (1.676 m), weight 78.1 kg, last menstrual period 05/18/2023, SpO2 100%, unknown if currently breastfeeding.  Physical Exam:  General: alert, cooperative, and no distress Lochia: appropriate Uterine Fundus: firm Incision: n/a DVT Evaluation: No evidence of DVT seen on physical exam.  Recent Labs    02/22/24 1532 02/23/24 0502  HGB 10.0* 9.7*  HCT 30.4* 30.3*    Assessment/Plan: 28yo G1P1001 on PPD$1 s/p svd - stable Low back pain - will trial a narcotic pain medication vs muscle relaxant to see if able to improve pain  Routine pp care  Likely discharge home tomorrow  Discussed circumcision - will have done today if baby cleared.     LOS: 1 day   Chelsey Swanson W Kyrsten Deleeuw, DO 02/23/2024, 2:09 PM

## 2024-02-23 NOTE — Plan of Care (Signed)

## 2024-02-24 MED ORDER — IBUPROFEN 600 MG PO TABS: 600.0000 mg | ORAL_TABLET | Freq: Four times a day (QID) | ORAL | Status: AC

## 2024-02-24 MED ORDER — ACETAMINOPHEN 325 MG PO TABS: 650.0000 mg | ORAL_TABLET | Freq: Four times a day (QID) | ORAL | Status: AC | PRN

## 2024-02-24 MED ORDER — OXYCODONE HCL 5 MG PO TABS: 5.0000 mg | ORAL_TABLET | ORAL | 0 refills | Status: AC | PRN

## 2024-02-24 MED ORDER — WITCH HAZEL-GLYCERIN EX PADS: 1.0000 | MEDICATED_PAD | CUTANEOUS | Status: AC | PRN

## 2024-02-24 MED ORDER — BENZOCAINE-MENTHOL 20-0.5 % EX AERO: 1.0000 | INHALATION_SPRAY | CUTANEOUS | Status: AC | PRN

## 2024-02-24 NOTE — Progress Notes (Signed)
 Post Partum Day 2  Subjective:  Patient is overall doing well. Back pain is improving. Notes some perineal discomfort from laceration and notes she was told to stop using ice to the area and to use heat + tucks and dermoplast. Lochia is appropriate. Voiding without difficulty. Ambulating without lightheadedness or dizziness. Denies HA, VC, CP, SOB, or RUQ pain. Breastfeeding with some formula supplementation due to cluster feeding.   Objective: Blood pressure 114/66, pulse 89, temperature 98.9 F (37.2 C), temperature source Oral, resp. rate 17, height 5' 6 (1.676 m), weight 78.1 kg, last menstrual period 05/18/2023, SpO2 99%, unknown if currently breastfeeding.  Physical Exam:  General: alert, cooperative, and no distress Lochia: appropriate Uterine Fundus: firm below the umbilicus  Incision: n/a DVT Evaluation: No evidence of DVT seen on physical exam.  Recent Labs    02/22/24 1532 02/23/24 0502  HGB 10.0* 9.7*  HCT 30.4* 30.3*    Assessment/Plan: 28yo G1P1001 on PPD#1 s/p svd - stable  Low back pain - improving w/ oxycodone  and heat gHTN: MRBP in clinic and on admit. Denies si/sx pre-e. BP check in 1w upon d/c Routine pp care - meeting milestone, breastfeeding with some formula supplementation, pp Hgb stable, discussed ok to continue ice pack to perineum  Female fetus - s/p circ on 12/11  Dispo: Discharge home today pending fetal d/c. BP check in 1w and pp visit in 6w   LOS: 2 days   Chelsey CHRISTELLA Oz, MD 02/24/2024, 10:07 AM

## 2024-02-24 NOTE — Lactation Note (Signed)
 This note was copied from a baby's chart. Lactation Consultation Note  Patient Name: Chelsey Swanson Date: 02/24/2024 Age:27 hours Reason for consult: Follow-up assessment;1st time breastfeeding;Term  P1, Mother latching baby when [facility] entered room. Baby came off.  Assisted with mother's permission to relatch baby.   Encouraged pumping if mother is giving formula to help establish her milk supply. Reviewed engorgement care and monitoring voids/stools.  Maternal Data Has patient been taught Hand Expression?: Yes Does the patient have breastfeeding experience prior to this delivery?: No  Feeding Mother's Current Feeding Choice: Breast Milk and Formula  LATCH Score Latch: Grasps breast easily, tongue down, lips flanged, rhythmical sucking.  Audible Swallowing: A few with stimulation  Type of Nipple: Everted at rest and after stimulation  Comfort (Breast/Nipple): Soft / non-tender  Hold (Positioning): Assistance needed to correctly position infant at breast and maintain latch.  LATCH Score: 8   Lactation Tools Discussed/Used    Interventions Interventions: Breast feeding basics reviewed;Assisted with latch;Hand express;Education  Discharge Discharge Education: Engorgement and breast care;Warning signs for feeding baby  Consult Status Consult Status: Complete Date: 02/24/24    Shannon Dines [facility] 02/24/2024, 3:27 PM

## 2024-02-24 NOTE — Discharge Summary (Signed)
 Postpartum Discharge Summary  Patient Name: Chelsey Swanson DOB: [birth year] MRN: [id number]  Date of admission: 02/22/2024 Delivery date:02/22/2024 Delivering provider: OKEY LEADER Date of discharge: 02/24/2024  Admitting diagnosis: Gestational hypertension affecting third pregnancy [O13.9] Spontaneous vaginal delivery [O80] Intrauterine pregnancy: [redacted]w[redacted]d     Secondary diagnosis:  Principal Problem:   Gestational hypertension affecting third pregnancy Active Problems:   Spontaneous vaginal delivery  Additional problems: none   Discharge diagnosis: Term Pregnancy Delivered and Gestational Hypertension                                              Post partum procedures:none Augmentation: AROM and Pitocin  Complications: None  Hospital course: Induction of Labor With Vaginal Delivery   28 y.o. yo G1P1001 at [redacted]w[redacted]d was admitted to the hospital 02/22/2024 for induction of labor.  Indication for induction: Elective.  Patient had an uncomplicated labor course .  Membrane Rupture Time/Date: 9:48 AM,02/22/2024  Delivery Method:Vaginal, Spontaneous Operative Delivery:N/A Episiotomy: None Lacerations:  2nd degree Details of delivery can be found in separate delivery note.    Patient ruled in for gestational hypertension postpartum. There was no need to start blood pressure medication. Admit Hgb was 10.7 with an appropriate drop to 9.7 on PPD1. She is breastfeeding. Patient is discharged home 02/24/2024.  Newborn Data: Birth date:02/22/2024 Birth time:2:47 PM Gender:Female Living status:Living Apgars:9 ,9  Weight:3590 g  Magnesium Sulfate received: No BMZ received: No Rhophylac:N/A MMR:N/A T-DaP:Given prenatally Flu: Yes RSV Vaccine received: Yes Transfusion:No Immunizations administered: Immunization History  Administered Date(s) Administered   DTaP 07/18/2008, 09/27/2008, 02/10/2009   Hepatitis A, Ped/Adol-2 Dose 07/18/2015   Influenza,inj,Quad PF,6+ Mos 01/29/2014    Influenza-Unspecified 04/17/2007, 01/18/2011, 05/08/2012   Meningococcal Conjugate 07/18/2008, 10/03/2014   Td 11/02/2007   Tdap 11/02/2007   Varicella 07/18/2008, 07/18/2015    Physical exam  Vitals:   02/23/24 1227 02/23/24 1413 02/23/24 2143 02/24/24 0530  BP:  130/82 121/71 114/66  Pulse:  96 82 89  Resp:  16 16 17   Temp: 98.1 F (36.7 C) 98 F (36.7 C) 97.8 F (36.6 C) 98.9 F (37.2 C)  TempSrc: Oral Oral Oral Oral  SpO2:  99%  99%  Weight:      Height:       General: alert, cooperative, and no distress Respiratory: no increased work of breathing  Lochia: appropriate Uterine Fundus: firm, below the umbilicus  Incision: vaginal laceration present, not examined  DVT Evaluation: No evidence of DVT seen on physical exam. Labs: Lab Results  Component Value Date   WBC 12.4 (H) 02/23/2024   HGB 9.7 (L) 02/23/2024   HCT 30.3 (L) 02/23/2024   MCV 88.1 02/23/2024   PLT 165 02/23/2024      Latest Ref Rng & Units 02/17/2024    3:02 PM  CMP  Glucose 70 - 99 mg/dL 89   BUN 6 - 20 mg/dL 6   Creatinine 9.55 - 8.99 mg/dL 9.36   Sodium 864 - 854 mmol/L 135   Potassium 3.5 - 5.1 mmol/L 3.7   Chloride 98 - 111 mmol/L 102   CO2 22 - 32 mmol/L 23   Calcium 8.9 - 10.3 mg/dL 8.8   Total Protein 6.5 - 8.1 g/dL 6.8   Total Bilirubin 0.0 - 1.2 mg/dL 0.5   Alkaline Phos 38 - 126 U/L 157   AST 15 - 41 U/L 29  ALT 0 - 44 U/L 17    Edinburgh Score:    02/23/2024   10:10 AM  Edinburgh Postnatal Depression Scale Screening Tool  I have been able to laugh and see the funny side of things. 0  I have looked forward with enjoyment to things. 0  I have blamed myself unnecessarily when things went wrong. 0  I have been anxious or worried for no good reason. 0  I have felt scared or panicky for no good reason. 0  Things have been getting on top of me. 0  I have been so unhappy that I have had difficulty sleeping. 0  I have felt sad or miserable. 0  I have been so unhappy that I have  been crying. 0  The thought of harming myself has occurred to me. 0  Edinburgh Postnatal Depression Scale Total 0      After visit meds:  Allergies as of 02/24/2024       Reactions   Other Other (See Comments), Cough   Pollen-runny nose,sneezing,itchy eyes        Medication List     TAKE these medications    acetaminophen  325 MG tablet Commonly known as: Tylenol  Take 2 tablets (650 mg total) by mouth every 6 (six) hours as needed (for pain scale < 4).   benzocaine -Menthol  20-0.5 % Aero Commonly known as: DERMOPLAST Apply 1 Application topically as needed for irritation (perineal discomfort).   Flonase  Allergy Relief 50 MCG/ACT nasal spray Generic drug: fluticasone  As needed for allergies As needed for allergies   ibuprofen  600 MG tablet Commonly known as: ADVIL  Take 1 tablet (600 mg total) by mouth every 6 (six) hours.   loratadine 10 MG tablet Commonly known as: CLARITIN Take 10 mg by mouth.   omeprazole  20 MG capsule Commonly known as: PRILOSEC Take 1 capsule (20 mg total) by mouth daily. 1 tablet a day   oxyCODONE  5 MG immediate release tablet Commonly known as: Oxy IR/ROXICODONE  Take 1 tablet (5 mg total) by mouth every 4 (four) hours as needed (pain scale 4-7).   PRENATAL VITAMIN PO Take by mouth. Gummy-takes 2 daily   witch hazel-glycerin pad Commonly known as: TUCKS Apply 1 Application topically as needed for hemorrhoids.   XYZAL PO Take by mouth.         Discharge home in stable condition Infant Feeding: Breast Infant Disposition:home with mother Discharge instruction: per After Visit Summary and Postpartum booklet. Activity: Advance as tolerated. Pelvic rest for 6 weeks.  Diet: routine diet Anticipated Birth Control: Unsure Postpartum Appointment:6 weeks Additional Postpartum F/U: BP check 1 week    02/24/2024 Charmaine CHRISTELLA Oz, MD

## 2024-02-25 LAB — BIRTH TISSUE RECOVERY COLLECTION (PLACENTA DONATION)

## 2024-02-29 ENCOUNTER — Telehealth (HOSPITAL_COMMUNITY): Payer: Self-pay | Admitting: *Deleted

## 2024-02-29 NOTE — Telephone Encounter (Signed)
 02/29/2024  Name: Chelsey Swanson MRN: [id number] DOB: 10-11-1995  Reason for Call:  Transition of Care Hospital Discharge Call  Contact Status: Patient Contact Status: Complete  Language assistant needed:          Follow-Up Questions: Do You Have Any Concerns About Your Health As You Heal From Delivery?: No Do You Have Any Concerns About Your Infants Health?: No  Edinburgh Postnatal Depression Scale:  In the Past 7 Days:   Patient reported that her answers are the same as when she completed the EPDS in the hospital. Score at that time was 0. Stated that she is doing well and has good support. EPDS not completed at this time PHQ2-9 Depression Scale:     Discharge Follow-up: Edinburgh score requires follow up?: N/A Patient was advised of the following resources:: Breastfeeding Support Group, Support Group Requested email information - sent by RN Post-discharge interventions: Reviewed Newborn Safe Sleep Practices  Signature Allean IVAR Carton, RN, 02/29/24, [phone]

## 2024-03-02 DIAGNOSIS — Z369 Encounter for antenatal screening, unspecified: Secondary | ICD-10-CM | POA: Diagnosis not present

## 2024-03-30 ENCOUNTER — Ambulatory Visit

## 2024-03-30 VITALS — BP 122/86 | HR 84 | Ht 66.0 in | Wt 138.0 lb

## 2024-03-30 DIAGNOSIS — R220 Localized swelling, mass and lump, head: Secondary | ICD-10-CM

## 2024-03-30 NOTE — Progress Notes (Addendum)
 NAME: Chelsey Swanson  MRN: [id number]  DOB: 02-Jul-1995   Referring physician: Suanne Pfeiffer, NP  PCP: Suanne Pfeiffer, NP   CHIEF COMPLAINT: Soft tissue mass of the scalp    HPI:  Chelsey Swanson is a 29 y.o. year old female who presents with a soft tissue mass that is non painful, soft and has enlarged over the last few months slowly. No episodes of infection noted. Denies smoking.  PMH:  Past Medical History:  Diagnosis Date   Abdominal pain, chronic, generalized 01/29/2014   Allergies    Anemia, iron deficiency 10/03/2014   Chest pain 05/07/2014   Chlamydia 02/24/2021   02/24/21 treated with doxycycline , POC_____   Dizziness    normal cardiac workup   Dysplastic nevus 02/12/2021   moderate, right lower abdomen   Dysplastic nevus 02/19/2021   moderate, right inframammary   H/O echocardiogram 04/15/2014   normal   IBS (irritable bowel syndrome)    Orthostatic hypotension 10/03/2014   Pre-syncope 10/15/2014   Pregnancy induced hypertension    Skull fracture (HCC)    29 yo   PSH:  Past Surgical History:  Procedure Laterality Date   ADENOIDECTOMY     TONSILLECTOMY     tubes in ears     WISDOM TOOTH EXTRACTION     ALLERGIES:  is allergic to other.   FAMILY HISTORY:  Family History  Problem Relation Age of Onset   Healthy Mother    Heart disease Father    Hypertension Brother    Lung cancer Paternal Grandmother       VITALS:  Vitals:   03/30/24 1439  BP: 122/86  Pulse: 84  SpO2: 99%    Constitutional: Good color, good hydration. VSS. Head and Neck: No lymphadenopathy, thyromegaly or masses  Chest: Normal breathing, Normal shape and excursion.  Cor: RRR  Scalp subcutaneous mass measures 1 cm x 0.5 cm. Currently not infected.  Mobile and not attached to underlying structures.  No basin lymphadenopathy, satellite lesions or in-transit lesions.  ASSESSMENT/PLAN  Assessment & Plan   Pt. presents with a subcutaneous mass most  representative of epidermal growth  Today we discussed the risks, benefits and alternatives to mass excision. We discussed the alternatives which include continued observation; however, I told the patient that I do not believe this mass will resolve on its own. We then discussed the benefits of surgical excision which include complete removal of the lesion. We discussed the risks of excision which include seroma, hematoma, infection, bleeding, damage to surrounding healthy tissue, damage to surrounding structures and need for further surgery. We also discussed the risks of hair loss, wound separation and recurrence of the mass. We discussed scar patterns.  I explained that the mass will be sent to pathology and if it were to be malignant further surgery may be needed. We discussed the risks of anesthesia. The patient has a good understanding of all the risks and benefits, postoperative course and care. We obtained pictures. All questions were answered.    Recommend excision in the treatment room. Risks, benefits and limitations were discussed. Procedure will be precertified and scheduled.  Demetri Kerman M. Krikor Willet, MD [facility]

## 2024-04-10 ENCOUNTER — Other Ambulatory Visit (HOSPITAL_COMMUNITY)
Admission: RE | Admit: 2024-04-10 | Discharge: 2024-04-10 | Disposition: A | Discharge status: Home / Self Care | Source: Ambulatory Visit

## 2024-04-10 ENCOUNTER — Ambulatory Visit (INDEPENDENT_AMBULATORY_CARE_PROVIDER_SITE_OTHER)

## 2024-04-10 VITALS — BP 116/69 | HR 112

## 2024-04-10 DIAGNOSIS — D224 Melanocytic nevi of scalp and neck: Secondary | ICD-10-CM

## 2024-04-10 DIAGNOSIS — R220 Localized swelling, mass and lump, head: Secondary | ICD-10-CM | POA: Diagnosis present

## 2024-04-10 MED ORDER — AMOXICILLIN-POT CLAVULANATE 875-125 MG PO TABS: 1.0000 | ORAL_TABLET | Freq: Two times a day (BID) | ORAL | 0 refills | Status: AC

## 2024-04-10 NOTE — Progress Notes (Signed)
 PROCEDURE NOTE PLASTIC SURGERY PROCEDURE NOTE   Procedure: Excision of scalp mass   Diagnosis: Mass of scalp   Surgeon: Nieves HERO. Lucilla Petrenko, MD   Anesthesia: 1% lidocaine  with epinephrine 1:100,000 Mixed with 0.25% Marcaine  50/50 dilution (total  5 mL)   Complications: None   Specimen: Mass, sent to phatology    DESCRIPTION OF THE PROCEDURE IN DETAIL:   We discussed the risks benefits and alternatives to lesion excision. We discussed the alternatives which would be observation of the lesion. We discussed of the risks of the lesion then becoming larger or becoming infected, complicating any future excision. We discussed the benefits of excision which include removal of the lesion and the ability to examine it under a microscope to rule out the presence of a malignancy. We also discussed the benefits of removing the lesion from a symptomatic perspective. We discussed the risks of surgery, including but not limited to infection, bleeding, damage to the surrounding healthy tissues and the need for further surgery. We also discussed the risks of wound separation, poor scarring and lesion recurrence. We also discussed the risks of anesthesia. The patient again voiced their understanding of the above and once again confirmed informed consent.   The patient was identified by name, date of birth and medical record number. The patient's scalp was prepped and draped in the standard sterile fashion using betadine solution. Timeout was conducted and preoperative instrument and needle counts were correct.    Local anesthesia was injected. The incision was then made around the scalp mass. The mass was circumferentially dissected, down to galea aponeurotica. The size of the mass was 1 cm x 0.5 cm. The mass was removed and sent as a pathological specimen. The wound was irrigated copiously. The scalp was underminded circumferentially. Hemostasis was achieved with Bovie electrocautery. The 3 centimeter wound  was closed in multiple layers using 3-0 Vicryl and 3-0 Prolene. The incision was dressed with bacitracin.    The patient tolerated the procedure well. There were no immediate complications. Postoperative instrument and needle counts were correct.    Postoperative care discussed, return to clinic in 14 days.   Xachary Hambly M. Florice Hindle, MD [facility]

## 2024-04-13 LAB — SURGICAL PATHOLOGY

## 2024-04-25 ENCOUNTER — Ambulatory Visit
